# Patient Record
Sex: Female | Born: 1965 | Race: White | Hispanic: No | Marital: Single | Smoking: Never smoker
Health system: Southern US, Community
[De-identification: ages and names within clinical notes are randomized; demographics above are authoritative.]

## PROBLEM LIST (undated history)

## (undated) DIAGNOSIS — Z923 Personal history of irradiation: Secondary | ICD-10-CM

## (undated) DIAGNOSIS — M810 Age-related osteoporosis without current pathological fracture: Secondary | ICD-10-CM

## (undated) DIAGNOSIS — M069 Rheumatoid arthritis, unspecified: Secondary | ICD-10-CM

## (undated) DIAGNOSIS — Z9221 Personal history of antineoplastic chemotherapy: Secondary | ICD-10-CM

## (undated) DIAGNOSIS — E039 Hypothyroidism, unspecified: Secondary | ICD-10-CM

## (undated) DIAGNOSIS — F418 Other specified anxiety disorders: Secondary | ICD-10-CM

## (undated) DIAGNOSIS — C50919 Malignant neoplasm of unspecified site of unspecified female breast: Secondary | ICD-10-CM

## (undated) HISTORY — DX: Rheumatoid arthritis, unspecified: M06.9

## (undated) HISTORY — PX: BREAST BIOPSY: SHX20

## (undated) HISTORY — DX: Personal history of irradiation: Z92.3

## (undated) HISTORY — DX: Hypothyroidism, unspecified: E03.9

## (undated) HISTORY — PX: THYROID SURGERY: SHX805

## (undated) HISTORY — PX: HAND SURGERY: SHX662

## (undated) HISTORY — DX: Malignant neoplasm of unspecified site of unspecified female breast: C50.919

## (undated) HISTORY — DX: Other specified anxiety disorders: F41.8

---
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
## 1992-12-24 HISTORY — PX: LIVER BIOPSY: SHX301

## 1994-12-24 HISTORY — PX: CHOLECYSTECTOMY: SHX55

## 1998-09-13 ENCOUNTER — Other Ambulatory Visit: Admission: RE | Admit: 1998-09-13 | Discharge: 1998-09-13 | Payer: Self-pay | Admitting: Obstetrics and Gynecology

## 1999-08-26 ENCOUNTER — Emergency Department (HOSPITAL_COMMUNITY): Admission: EM | Admit: 1999-08-26 | Discharge: 1999-08-26 | Payer: Self-pay | Admitting: Emergency Medicine

## 1999-08-27 ENCOUNTER — Encounter: Payer: Self-pay | Admitting: Emergency Medicine

## 1999-09-15 ENCOUNTER — Other Ambulatory Visit: Admission: RE | Admit: 1999-09-15 | Discharge: 1999-09-15 | Payer: Self-pay | Admitting: Obstetrics and Gynecology

## 2000-07-23 ENCOUNTER — Emergency Department (HOSPITAL_COMMUNITY): Admission: EM | Admit: 2000-07-23 | Discharge: 2000-07-23 | Payer: Self-pay | Admitting: Emergency Medicine

## 2000-08-29 ENCOUNTER — Other Ambulatory Visit: Admission: RE | Admit: 2000-08-29 | Discharge: 2000-08-29 | Payer: Self-pay | Admitting: Obstetrics and Gynecology

## 2000-12-09 ENCOUNTER — Encounter: Payer: Self-pay | Admitting: Internal Medicine

## 2000-12-09 ENCOUNTER — Encounter: Admission: RE | Admit: 2000-12-09 | Discharge: 2000-12-09 | Payer: Self-pay | Admitting: Internal Medicine

## 2001-01-07 ENCOUNTER — Ambulatory Visit (HOSPITAL_COMMUNITY): Admission: RE | Admit: 2001-01-07 | Discharge: 2001-01-08 | Payer: Self-pay | Admitting: *Deleted

## 2001-01-07 ENCOUNTER — Encounter (INDEPENDENT_AMBULATORY_CARE_PROVIDER_SITE_OTHER): Payer: Self-pay | Admitting: Specialist

## 2001-07-12 ENCOUNTER — Emergency Department (HOSPITAL_COMMUNITY): Admission: EM | Admit: 2001-07-12 | Discharge: 2001-07-12 | Payer: Self-pay | Admitting: Emergency Medicine

## 2001-09-11 ENCOUNTER — Other Ambulatory Visit: Admission: RE | Admit: 2001-09-11 | Discharge: 2001-09-11 | Payer: Self-pay | Admitting: Obstetrics and Gynecology

## 2001-12-24 HISTORY — PX: BREAST LUMPECTOMY: SHX2

## 2002-09-17 ENCOUNTER — Other Ambulatory Visit: Admission: RE | Admit: 2002-09-17 | Discharge: 2002-09-17 | Payer: Self-pay | Admitting: Obstetrics and Gynecology

## 2002-11-13 ENCOUNTER — Encounter: Payer: Self-pay | Admitting: Obstetrics and Gynecology

## 2002-11-13 ENCOUNTER — Encounter (INDEPENDENT_AMBULATORY_CARE_PROVIDER_SITE_OTHER): Payer: Self-pay | Admitting: *Deleted

## 2002-11-13 ENCOUNTER — Encounter: Admission: RE | Admit: 2002-11-13 | Discharge: 2002-11-13 | Payer: Self-pay | Admitting: Obstetrics and Gynecology

## 2002-12-02 ENCOUNTER — Encounter (INDEPENDENT_AMBULATORY_CARE_PROVIDER_SITE_OTHER): Payer: Self-pay | Admitting: *Deleted

## 2002-12-02 ENCOUNTER — Ambulatory Visit (HOSPITAL_BASED_OUTPATIENT_CLINIC_OR_DEPARTMENT_OTHER): Admission: RE | Admit: 2002-12-02 | Discharge: 2002-12-02 | Payer: Self-pay | Admitting: *Deleted

## 2002-12-22 ENCOUNTER — Ambulatory Visit (HOSPITAL_BASED_OUTPATIENT_CLINIC_OR_DEPARTMENT_OTHER): Admission: RE | Admit: 2002-12-22 | Discharge: 2002-12-22 | Payer: Self-pay | Admitting: *Deleted

## 2002-12-22 ENCOUNTER — Encounter (INDEPENDENT_AMBULATORY_CARE_PROVIDER_SITE_OTHER): Payer: Self-pay | Admitting: Specialist

## 2002-12-22 ENCOUNTER — Encounter: Payer: Self-pay | Admitting: *Deleted

## 2003-01-08 ENCOUNTER — Ambulatory Visit (HOSPITAL_COMMUNITY): Admission: RE | Admit: 2003-01-08 | Discharge: 2003-01-08 | Payer: Self-pay | Admitting: Oncology

## 2003-01-11 ENCOUNTER — Encounter: Payer: Self-pay | Admitting: Oncology

## 2003-01-11 ENCOUNTER — Ambulatory Visit (HOSPITAL_COMMUNITY): Admission: RE | Admit: 2003-01-11 | Discharge: 2003-01-11 | Payer: Self-pay | Admitting: Oncology

## 2003-01-14 ENCOUNTER — Ambulatory Visit (HOSPITAL_COMMUNITY): Admission: RE | Admit: 2003-01-14 | Discharge: 2003-01-14 | Payer: Self-pay | Admitting: Oncology

## 2003-01-14 ENCOUNTER — Encounter: Payer: Self-pay | Admitting: Oncology

## 2003-01-18 ENCOUNTER — Ambulatory Visit (HOSPITAL_BASED_OUTPATIENT_CLINIC_OR_DEPARTMENT_OTHER): Admission: RE | Admit: 2003-01-18 | Discharge: 2003-01-18 | Payer: Self-pay | Admitting: *Deleted

## 2003-01-18 ENCOUNTER — Encounter: Payer: Self-pay | Admitting: *Deleted

## 2003-01-27 ENCOUNTER — Encounter: Payer: Self-pay | Admitting: Emergency Medicine

## 2003-01-28 ENCOUNTER — Inpatient Hospital Stay (HOSPITAL_COMMUNITY): Admission: EM | Admit: 2003-01-28 | Discharge: 2003-01-31 | Payer: Self-pay | Admitting: Emergency Medicine

## 2003-01-29 ENCOUNTER — Encounter: Payer: Self-pay | Admitting: Oncology

## 2003-01-30 ENCOUNTER — Encounter: Payer: Self-pay | Admitting: Oncology

## 2003-05-11 ENCOUNTER — Inpatient Hospital Stay (HOSPITAL_COMMUNITY): Admission: EM | Admit: 2003-05-11 | Discharge: 2003-05-13 | Payer: Self-pay | Admitting: Oncology

## 2003-05-11 ENCOUNTER — Encounter: Payer: Self-pay | Admitting: Oncology

## 2003-05-28 ENCOUNTER — Ambulatory Visit (HOSPITAL_BASED_OUTPATIENT_CLINIC_OR_DEPARTMENT_OTHER): Admission: RE | Admit: 2003-05-28 | Discharge: 2003-05-28 | Payer: Self-pay | Admitting: *Deleted

## 2003-06-11 ENCOUNTER — Ambulatory Visit: Admission: RE | Admit: 2003-06-11 | Discharge: 2003-09-01 | Payer: Self-pay | Admitting: Radiation Oncology

## 2003-06-16 ENCOUNTER — Encounter: Admission: RE | Admit: 2003-06-16 | Discharge: 2003-06-16 | Payer: Self-pay | Admitting: Radiation Oncology

## 2003-06-23 ENCOUNTER — Encounter (HOSPITAL_COMMUNITY): Admission: RE | Admit: 2003-06-23 | Discharge: 2003-09-21 | Payer: Self-pay | Admitting: Radiation Oncology

## 2003-09-06 ENCOUNTER — Other Ambulatory Visit: Admission: RE | Admit: 2003-09-06 | Discharge: 2003-09-06 | Payer: Self-pay | Admitting: Obstetrics and Gynecology

## 2003-11-09 ENCOUNTER — Ambulatory Visit (HOSPITAL_COMMUNITY): Admission: RE | Admit: 2003-11-09 | Discharge: 2003-11-09 | Payer: Self-pay | Admitting: Radiation Oncology

## 2003-11-23 ENCOUNTER — Ambulatory Visit: Admission: RE | Admit: 2003-11-23 | Discharge: 2003-11-23 | Payer: Self-pay | Admitting: Radiation Oncology

## 2003-11-25 ENCOUNTER — Ambulatory Visit (HOSPITAL_COMMUNITY): Admission: RE | Admit: 2003-11-25 | Discharge: 2003-11-25 | Payer: Self-pay | Admitting: Oncology

## 2003-12-13 ENCOUNTER — Encounter: Admission: RE | Admit: 2003-12-13 | Discharge: 2003-12-13 | Payer: Self-pay | Admitting: Radiation Oncology

## 2004-02-01 ENCOUNTER — Ambulatory Visit: Admission: RE | Admit: 2004-02-01 | Discharge: 2004-02-01 | Payer: Self-pay | Admitting: Radiation Oncology

## 2004-04-12 ENCOUNTER — Ambulatory Visit: Admission: RE | Admit: 2004-04-12 | Discharge: 2004-04-12 | Payer: Self-pay | Admitting: Radiation Oncology

## 2004-07-25 ENCOUNTER — Ambulatory Visit: Admission: RE | Admit: 2004-07-25 | Discharge: 2004-07-25 | Payer: Self-pay | Admitting: Radiation Oncology

## 2004-09-13 ENCOUNTER — Other Ambulatory Visit: Admission: RE | Admit: 2004-09-13 | Discharge: 2004-09-13 | Payer: Self-pay | Admitting: Obstetrics and Gynecology

## 2004-09-26 ENCOUNTER — Ambulatory Visit: Admission: RE | Admit: 2004-09-26 | Discharge: 2004-09-26 | Payer: Self-pay | Admitting: Radiation Oncology

## 2004-11-28 ENCOUNTER — Ambulatory Visit: Admission: RE | Admit: 2004-11-28 | Discharge: 2004-11-28 | Payer: Self-pay | Admitting: Radiation Oncology

## 2004-12-13 ENCOUNTER — Encounter: Admission: RE | Admit: 2004-12-13 | Discharge: 2004-12-13 | Payer: Self-pay | Admitting: Oncology

## 2004-12-23 ENCOUNTER — Encounter: Admission: RE | Admit: 2004-12-23 | Discharge: 2004-12-23 | Payer: Self-pay | Admitting: Oncology

## 2005-01-02 ENCOUNTER — Ambulatory Visit: Payer: Self-pay | Admitting: Oncology

## 2005-01-31 ENCOUNTER — Ambulatory Visit: Admission: RE | Admit: 2005-01-31 | Discharge: 2005-01-31 | Payer: Self-pay | Admitting: Radiation Oncology

## 2005-02-05 ENCOUNTER — Ambulatory Visit (HOSPITAL_COMMUNITY): Admission: RE | Admit: 2005-02-05 | Discharge: 2005-02-05 | Payer: Self-pay | Admitting: Oncology

## 2005-05-04 ENCOUNTER — Ambulatory Visit: Payer: Self-pay | Admitting: Oncology

## 2005-08-21 ENCOUNTER — Other Ambulatory Visit: Admission: RE | Admit: 2005-08-21 | Discharge: 2005-08-21 | Payer: Self-pay | Admitting: Obstetrics and Gynecology

## 2005-09-18 ENCOUNTER — Ambulatory Visit: Payer: Self-pay | Admitting: Oncology

## 2005-10-03 ENCOUNTER — Ambulatory Visit: Admission: RE | Admit: 2005-10-03 | Discharge: 2005-10-05 | Payer: Self-pay | Admitting: Radiation Oncology

## 2005-12-13 ENCOUNTER — Encounter: Admission: RE | Admit: 2005-12-13 | Discharge: 2005-12-13 | Payer: Self-pay | Admitting: Oncology

## 2006-03-20 ENCOUNTER — Ambulatory Visit: Payer: Self-pay | Admitting: Oncology

## 2006-04-03 ENCOUNTER — Encounter: Payer: Self-pay | Admitting: Cardiology

## 2006-04-03 ENCOUNTER — Ambulatory Visit: Admission: RE | Admit: 2006-04-03 | Discharge: 2006-04-03 | Payer: Self-pay | Admitting: Oncology

## 2006-08-03 ENCOUNTER — Emergency Department (HOSPITAL_COMMUNITY): Admission: EM | Admit: 2006-08-03 | Discharge: 2006-08-04 | Payer: Self-pay | Admitting: Emergency Medicine

## 2006-09-16 ENCOUNTER — Ambulatory Visit: Payer: Self-pay | Admitting: Oncology

## 2006-09-18 LAB — COMPREHENSIVE METABOLIC PANEL
ALT: 10 U/L (ref 0–40)
AST: 14 U/L (ref 0–37)
CO2: 29 mEq/L (ref 19–32)
Chloride: 103 mEq/L (ref 96–112)
Creatinine, Ser: 0.82 mg/dL (ref 0.40–1.20)
Sodium: 139 mEq/L (ref 135–145)
Total Bilirubin: 0.4 mg/dL (ref 0.3–1.2)
Total Protein: 7.2 g/dL (ref 6.0–8.3)

## 2006-09-18 LAB — CBC WITH DIFFERENTIAL/PLATELET
BASO%: 0.5 % (ref 0.0–2.0)
EOS%: 0.8 % (ref 0.0–7.0)
LYMPH%: 35 % (ref 14.0–48.0)
MCH: 31.3 pg (ref 26.0–34.0)
MCHC: 34.1 g/dL (ref 32.0–36.0)
MONO#: 0.3 10*3/uL (ref 0.1–0.9)
RBC: 4.39 10*6/uL (ref 3.70–5.32)
WBC: 4.1 10*3/uL (ref 3.9–10.0)
lymph#: 1.4 10*3/uL (ref 0.9–3.3)

## 2006-09-18 LAB — LACTATE DEHYDROGENASE: LDH: 134 U/L (ref 94–250)

## 2006-12-19 ENCOUNTER — Encounter: Admission: RE | Admit: 2006-12-19 | Discharge: 2006-12-19 | Payer: Self-pay | Admitting: Oncology

## 2007-03-20 ENCOUNTER — Ambulatory Visit: Payer: Self-pay | Admitting: Oncology

## 2007-03-25 LAB — CBC WITH DIFFERENTIAL/PLATELET
BASO%: 0.4 % (ref 0.0–2.0)
Basophils Absolute: 0 10*3/uL (ref 0.0–0.1)
EOS%: 0.9 % (ref 0.0–7.0)
HCT: 37.5 % (ref 34.8–46.6)
HGB: 13.2 g/dL (ref 11.6–15.9)
LYMPH%: 37.7 % (ref 14.0–48.0)
MCH: 32.4 pg (ref 26.0–34.0)
MCHC: 35.2 g/dL (ref 32.0–36.0)
MCV: 92.2 fL (ref 81.0–101.0)
MONO%: 7.7 % (ref 0.0–13.0)
NEUT%: 53.3 % (ref 39.6–76.8)
lymph#: 1.9 10*3/uL (ref 0.9–3.3)

## 2007-03-25 LAB — COMPREHENSIVE METABOLIC PANEL
ALT: 10 U/L (ref 0–35)
AST: 17 U/L (ref 0–37)
Alkaline Phosphatase: 111 U/L (ref 39–117)
BUN: 7 mg/dL (ref 6–23)
Calcium: 9.2 mg/dL (ref 8.4–10.5)
Creatinine, Ser: 0.77 mg/dL (ref 0.40–1.20)
Total Bilirubin: 0.5 mg/dL (ref 0.3–1.2)

## 2007-10-03 ENCOUNTER — Ambulatory Visit: Payer: Self-pay | Admitting: Oncology

## 2007-10-08 LAB — CBC WITH DIFFERENTIAL/PLATELET
BASO%: 0.4 % (ref 0.0–2.0)
EOS%: 0.8 % (ref 0.0–7.0)
MCH: 32.7 pg (ref 26.0–34.0)
MCHC: 35.1 g/dL (ref 32.0–36.0)
MCV: 93 fL (ref 81.0–101.0)
MONO%: 4.7 % (ref 0.0–13.0)
RBC: 4.07 10*6/uL (ref 3.70–5.32)
RDW: 11.7 % (ref 11.3–14.5)
lymph#: 1.7 10*3/uL (ref 0.9–3.3)

## 2007-10-08 LAB — COMPREHENSIVE METABOLIC PANEL
ALT: 13 U/L (ref 0–35)
AST: 15 U/L (ref 0–37)
Albumin: 4.5 g/dL (ref 3.5–5.2)
Alkaline Phosphatase: 78 U/L (ref 39–117)
Calcium: 9.2 mg/dL (ref 8.4–10.5)
Chloride: 103 mEq/L (ref 96–112)
Potassium: 3.9 mEq/L (ref 3.5–5.3)

## 2007-11-18 ENCOUNTER — Ambulatory Visit: Payer: Self-pay | Admitting: Oncology

## 2007-12-22 ENCOUNTER — Encounter: Admission: RE | Admit: 2007-12-22 | Discharge: 2007-12-22 | Payer: Self-pay | Admitting: Oncology

## 2008-02-10 ENCOUNTER — Ambulatory Visit (HOSPITAL_BASED_OUTPATIENT_CLINIC_OR_DEPARTMENT_OTHER): Admission: RE | Admit: 2008-02-10 | Discharge: 2008-02-10 | Payer: Self-pay | Admitting: Orthopedic Surgery

## 2008-02-18 ENCOUNTER — Encounter: Admission: RE | Admit: 2008-02-18 | Discharge: 2008-02-18 | Payer: Self-pay | Admitting: Internal Medicine

## 2008-04-15 ENCOUNTER — Emergency Department (HOSPITAL_COMMUNITY): Admission: EM | Admit: 2008-04-15 | Discharge: 2008-04-15 | Payer: Self-pay | Admitting: Emergency Medicine

## 2008-05-07 ENCOUNTER — Ambulatory Visit: Payer: Self-pay | Admitting: Oncology

## 2008-08-05 ENCOUNTER — Ambulatory Visit: Payer: Self-pay | Admitting: Oncology

## 2008-08-05 ENCOUNTER — Ambulatory Visit: Admission: RE | Admit: 2008-08-05 | Discharge: 2008-08-05 | Payer: Self-pay | Admitting: Radiation Oncology

## 2008-08-05 LAB — COMPREHENSIVE METABOLIC PANEL
ALT: 14 U/L (ref 0–35)
AST: 19 U/L (ref 0–37)
Alkaline Phosphatase: 93 U/L (ref 39–117)
Creatinine, Ser: 0.74 mg/dL (ref 0.40–1.20)
Sodium: 138 mEq/L (ref 135–145)
Total Bilirubin: 0.5 mg/dL (ref 0.3–1.2)
Total Protein: 7.3 g/dL (ref 6.0–8.3)

## 2008-08-05 LAB — CBC WITH DIFFERENTIAL/PLATELET
BASO%: 0.6 % (ref 0.0–2.0)
EOS%: 1.3 % (ref 0.0–7.0)
LYMPH%: 36.6 % (ref 14.0–48.0)
MCH: 32.6 pg (ref 26.0–34.0)
MCHC: 34.6 g/dL (ref 32.0–36.0)
MONO#: 0.4 10*3/uL (ref 0.1–0.9)
MONO%: 9 % (ref 0.0–13.0)
Platelets: 189 10*3/uL (ref 145–400)
RBC: 4.12 10*6/uL (ref 3.70–5.32)
WBC: 4.4 10*3/uL (ref 3.9–10.0)

## 2008-08-09 ENCOUNTER — Encounter: Admission: RE | Admit: 2008-08-09 | Discharge: 2008-08-09 | Payer: Self-pay | Admitting: Radiation Oncology

## 2008-09-13 ENCOUNTER — Ambulatory Visit: Payer: Self-pay | Admitting: Pulmonary Disease

## 2008-09-13 DIAGNOSIS — R05 Cough: Secondary | ICD-10-CM

## 2008-09-13 DIAGNOSIS — J309 Allergic rhinitis, unspecified: Secondary | ICD-10-CM | POA: Insufficient documentation

## 2008-09-13 DIAGNOSIS — J479 Bronchiectasis, uncomplicated: Secondary | ICD-10-CM | POA: Insufficient documentation

## 2008-09-13 DIAGNOSIS — R51 Headache: Secondary | ICD-10-CM | POA: Insufficient documentation

## 2008-09-13 DIAGNOSIS — Z853 Personal history of malignant neoplasm of breast: Secondary | ICD-10-CM | POA: Insufficient documentation

## 2008-09-13 DIAGNOSIS — R059 Cough, unspecified: Secondary | ICD-10-CM | POA: Insufficient documentation

## 2008-09-13 DIAGNOSIS — R0602 Shortness of breath: Secondary | ICD-10-CM | POA: Insufficient documentation

## 2008-09-13 DIAGNOSIS — R519 Headache, unspecified: Secondary | ICD-10-CM | POA: Insufficient documentation

## 2008-09-28 ENCOUNTER — Ambulatory Visit: Payer: Self-pay | Admitting: Pulmonary Disease

## 2008-10-07 ENCOUNTER — Ambulatory Visit: Payer: Self-pay | Admitting: Pulmonary Disease

## 2008-10-11 ENCOUNTER — Telehealth (INDEPENDENT_AMBULATORY_CARE_PROVIDER_SITE_OTHER): Payer: Self-pay | Admitting: *Deleted

## 2008-12-01 ENCOUNTER — Encounter: Admission: RE | Admit: 2008-12-01 | Discharge: 2008-12-01 | Payer: Self-pay | Admitting: Endocrinology

## 2008-12-06 ENCOUNTER — Encounter: Payer: Self-pay | Admitting: Pulmonary Disease

## 2008-12-15 ENCOUNTER — Ambulatory Visit: Payer: Self-pay | Admitting: Oncology

## 2008-12-21 LAB — CBC WITH DIFFERENTIAL/PLATELET
EOS%: 0.8 % (ref 0.0–7.0)
LYMPH%: 38.1 % (ref 14.0–48.0)
MCH: 32.8 pg (ref 26.0–34.0)
MCHC: 34.2 g/dL (ref 32.0–36.0)
MCV: 95.8 fL (ref 81.0–101.0)
MONO%: 6.3 % (ref 0.0–13.0)
Platelets: 251 10*3/uL (ref 145–400)
RBC: 4.16 10*6/uL (ref 3.70–5.32)
RDW: 13.5 % (ref 11.3–14.5)

## 2008-12-22 ENCOUNTER — Encounter: Admission: RE | Admit: 2008-12-22 | Discharge: 2008-12-22 | Payer: Self-pay | Admitting: Oncology

## 2008-12-22 LAB — COMPREHENSIVE METABOLIC PANEL
AST: 19 U/L (ref 0–37)
Albumin: 4.7 g/dL (ref 3.5–5.2)
BUN: 7 mg/dL (ref 6–23)
Calcium: 9.3 mg/dL (ref 8.4–10.5)
Chloride: 103 mEq/L (ref 96–112)
Glucose, Bld: 73 mg/dL (ref 70–99)
Potassium: 3.6 mEq/L (ref 3.5–5.3)
Total Protein: 7.5 g/dL (ref 6.0–8.3)

## 2008-12-30 ENCOUNTER — Emergency Department (HOSPITAL_COMMUNITY): Admission: EM | Admit: 2008-12-30 | Discharge: 2008-12-31 | Payer: Self-pay | Admitting: Emergency Medicine

## 2009-01-06 ENCOUNTER — Ambulatory Visit (HOSPITAL_COMMUNITY): Admission: RE | Admit: 2009-01-06 | Discharge: 2009-01-06 | Payer: Self-pay | Admitting: Orthopedic Surgery

## 2009-01-19 ENCOUNTER — Ambulatory Visit (HOSPITAL_COMMUNITY): Admission: RE | Admit: 2009-01-19 | Discharge: 2009-01-19 | Payer: Self-pay | Admitting: Oncology

## 2009-02-18 ENCOUNTER — Emergency Department (HOSPITAL_COMMUNITY): Admission: EM | Admit: 2009-02-18 | Discharge: 2009-02-18 | Payer: Self-pay | Admitting: Emergency Medicine

## 2009-02-25 ENCOUNTER — Encounter: Admission: RE | Admit: 2009-02-25 | Discharge: 2009-02-25 | Payer: Self-pay | Admitting: Internal Medicine

## 2009-04-27 ENCOUNTER — Ambulatory Visit: Admission: RE | Admit: 2009-04-27 | Discharge: 2009-04-28 | Payer: Self-pay | Admitting: Radiation Oncology

## 2009-04-28 LAB — CBC WITH DIFFERENTIAL/PLATELET
BASO%: 0.4 % (ref 0.0–2.0)
EOS%: 0.9 % (ref 0.0–7.0)
Eosinophils Absolute: 0 10*3/uL (ref 0.0–0.5)
LYMPH%: 39.5 % (ref 14.0–49.7)
MCHC: 34 g/dL (ref 31.5–36.0)
MCV: 97.4 fL (ref 79.5–101.0)
MONO%: 8.1 % (ref 0.0–14.0)
NEUT#: 2 10*3/uL (ref 1.5–6.5)
RBC: 4.07 10*6/uL (ref 3.70–5.45)
RDW: 13.3 % (ref 11.2–14.5)

## 2009-04-28 LAB — COMPREHENSIVE METABOLIC PANEL
ALT: 15 U/L (ref 0–35)
AST: 19 U/L (ref 0–37)
Albumin: 4.6 g/dL (ref 3.5–5.2)
Alkaline Phosphatase: 88 U/L (ref 39–117)
Glucose, Bld: 63 mg/dL — ABNORMAL LOW (ref 70–99)
Potassium: 3.5 mEq/L (ref 3.5–5.3)
Sodium: 142 mEq/L (ref 135–145)
Total Bilirubin: 0.4 mg/dL (ref 0.3–1.2)
Total Protein: 7.3 g/dL (ref 6.0–8.3)

## 2009-05-03 ENCOUNTER — Ambulatory Visit (HOSPITAL_COMMUNITY): Admission: RE | Admit: 2009-05-03 | Discharge: 2009-05-03 | Payer: Self-pay | Admitting: Radiation Oncology

## 2009-05-06 ENCOUNTER — Emergency Department (HOSPITAL_COMMUNITY): Admission: EM | Admit: 2009-05-06 | Discharge: 2009-05-07 | Payer: Self-pay | Admitting: Emergency Medicine

## 2009-05-10 ENCOUNTER — Ambulatory Visit (HOSPITAL_BASED_OUTPATIENT_CLINIC_OR_DEPARTMENT_OTHER): Admission: RE | Admit: 2009-05-10 | Discharge: 2009-05-10 | Payer: Self-pay | Admitting: Orthopedic Surgery

## 2009-06-17 ENCOUNTER — Ambulatory Visit: Payer: Self-pay | Admitting: Oncology

## 2009-06-21 LAB — CBC WITH DIFFERENTIAL/PLATELET
Basophils Absolute: 0 10*3/uL (ref 0.0–0.1)
EOS%: 0.8 % (ref 0.0–7.0)
Eosinophils Absolute: 0 10*3/uL (ref 0.0–0.5)
HGB: 13.8 g/dL (ref 11.6–15.9)
NEUT#: 2.8 10*3/uL (ref 1.5–6.5)
RDW: 12.5 % (ref 11.2–14.5)
lymph#: 1.5 10*3/uL (ref 0.9–3.3)

## 2009-06-21 LAB — COMPREHENSIVE METABOLIC PANEL
AST: 21 U/L (ref 0–37)
Albumin: 4.3 g/dL (ref 3.5–5.2)
BUN: 7 mg/dL (ref 6–23)
Calcium: 9.6 mg/dL (ref 8.4–10.5)
Chloride: 105 mEq/L (ref 96–112)
Glucose, Bld: 67 mg/dL — ABNORMAL LOW (ref 70–99)
Potassium: 3.5 mEq/L (ref 3.5–5.3)
Sodium: 140 mEq/L (ref 135–145)
Total Protein: 7.3 g/dL (ref 6.0–8.3)

## 2009-07-29 ENCOUNTER — Encounter: Admission: RE | Admit: 2009-07-29 | Discharge: 2009-07-29 | Payer: Self-pay | Admitting: Neurological Surgery

## 2009-12-22 ENCOUNTER — Ambulatory Visit: Payer: Self-pay | Admitting: Oncology

## 2009-12-27 LAB — CBC WITH DIFFERENTIAL/PLATELET
BASO%: 0.5 % (ref 0.0–2.0)
Eosinophils Absolute: 0 10*3/uL (ref 0.0–0.5)
HCT: 40.6 % (ref 34.8–46.6)
HGB: 13.7 g/dL (ref 11.6–15.9)
MCHC: 33.7 g/dL (ref 31.5–36.0)
MONO#: 0.3 10*3/uL (ref 0.1–0.9)
NEUT#: 2.5 10*3/uL (ref 1.5–6.5)
NEUT%: 56.8 % (ref 38.4–76.8)
Platelets: 214 10*3/uL (ref 145–400)
WBC: 4.5 10*3/uL (ref 3.9–10.3)
lymph#: 1.5 10*3/uL (ref 0.9–3.3)

## 2009-12-27 LAB — COMPREHENSIVE METABOLIC PANEL
ALT: 16 U/L (ref 0–35)
CO2: 31 mEq/L (ref 19–32)
Calcium: 9.2 mg/dL (ref 8.4–10.5)
Chloride: 101 mEq/L (ref 96–112)
Creatinine, Ser: 0.72 mg/dL (ref 0.40–1.20)
Glucose, Bld: 67 mg/dL — ABNORMAL LOW (ref 70–99)
Total Protein: 7.5 g/dL (ref 6.0–8.3)

## 2009-12-27 LAB — LACTATE DEHYDROGENASE: LDH: 142 U/L (ref 94–250)

## 2009-12-29 ENCOUNTER — Encounter: Admission: RE | Admit: 2009-12-29 | Discharge: 2009-12-29 | Payer: Self-pay | Admitting: Oncology

## 2010-01-27 ENCOUNTER — Encounter: Admission: RE | Admit: 2010-01-27 | Discharge: 2010-01-27 | Payer: Self-pay | Admitting: Surgery

## 2010-07-12 ENCOUNTER — Ambulatory Visit: Payer: Self-pay | Admitting: Oncology

## 2010-07-14 LAB — CBC WITH DIFFERENTIAL/PLATELET
BASO%: 0.5 % (ref 0.0–2.0)
EOS%: 0.9 % (ref 0.0–7.0)
HGB: 13.2 g/dL (ref 11.6–15.9)
MCH: 32 pg (ref 25.1–34.0)
MCHC: 33.8 g/dL (ref 31.5–36.0)
RDW: 13.1 % (ref 11.2–14.5)
WBC: 3.6 10*3/uL — ABNORMAL LOW (ref 3.9–10.3)
lymph#: 1.5 10*3/uL (ref 0.9–3.3)

## 2010-07-14 LAB — COMPREHENSIVE METABOLIC PANEL
ALT: 21 U/L (ref 0–35)
AST: 21 U/L (ref 0–37)
Albumin: 4.4 g/dL (ref 3.5–5.2)
Calcium: 9.3 mg/dL (ref 8.4–10.5)
Chloride: 105 mEq/L (ref 96–112)
Potassium: 3.7 mEq/L (ref 3.5–5.3)
Total Protein: 7.3 g/dL (ref 6.0–8.3)

## 2010-07-29 ENCOUNTER — Emergency Department (HOSPITAL_COMMUNITY): Admission: EM | Admit: 2010-07-29 | Discharge: 2010-07-29 | Payer: Self-pay | Admitting: Emergency Medicine

## 2011-01-02 ENCOUNTER — Encounter
Admission: RE | Admit: 2011-01-02 | Discharge: 2011-01-02 | Payer: Self-pay | Source: Home / Self Care | Attending: Oncology | Admitting: Oncology

## 2011-01-09 ENCOUNTER — Ambulatory Visit: Payer: Self-pay | Admitting: Oncology

## 2011-01-11 LAB — COMPREHENSIVE METABOLIC PANEL
ALT: 16 U/L (ref 0–35)
AST: 22 U/L (ref 0–37)
Albumin: 4.2 g/dL (ref 3.5–5.2)
Alkaline Phosphatase: 67 U/L (ref 39–117)
BUN: 4 mg/dL — ABNORMAL LOW (ref 6–23)
CO2: 29 mEq/L (ref 19–32)
Calcium: 9.6 mg/dL (ref 8.4–10.5)
Chloride: 106 mEq/L (ref 96–112)
Creatinine, Ser: 0.9 mg/dL (ref 0.40–1.20)
Glucose, Bld: 68 mg/dL — ABNORMAL LOW (ref 70–99)
Potassium: 3.4 mEq/L — ABNORMAL LOW (ref 3.5–5.3)
Sodium: 143 mEq/L (ref 135–145)
Total Bilirubin: 0.7 mg/dL (ref 0.3–1.2)
Total Protein: 7.5 g/dL (ref 6.0–8.3)

## 2011-01-11 LAB — CBC WITH DIFFERENTIAL/PLATELET
BASO%: 0.4 % (ref 0.0–2.0)
Basophils Absolute: 0 10*3/uL (ref 0.0–0.1)
EOS%: 1 % (ref 0.0–7.0)
Eosinophils Absolute: 0 10*3/uL (ref 0.0–0.5)
HCT: 39.7 % (ref 34.8–46.6)
HGB: 13.6 g/dL (ref 11.6–15.9)
LYMPH%: 43.7 % (ref 14.0–49.7)
MCH: 32.3 pg (ref 25.1–34.0)
MCHC: 34.2 g/dL (ref 31.5–36.0)
MCV: 94.7 fL (ref 79.5–101.0)
MONO#: 0.3 10*3/uL (ref 0.1–0.9)
MONO%: 8 % (ref 0.0–14.0)
NEUT#: 1.8 10*3/uL (ref 1.5–6.5)
NEUT%: 46.9 % (ref 38.4–76.8)
Platelets: 191 10*3/uL (ref 145–400)
RBC: 4.19 10*6/uL (ref 3.70–5.45)
RDW: 12.5 % (ref 11.2–14.5)
WBC: 3.8 10*3/uL — ABNORMAL LOW (ref 3.9–10.3)
lymph#: 1.7 10*3/uL (ref 0.9–3.3)

## 2011-01-11 LAB — LACTATE DEHYDROGENASE: LDH: 143 U/L (ref 94–250)

## 2011-01-13 ENCOUNTER — Encounter: Payer: Self-pay | Admitting: Oncology

## 2011-01-14 ENCOUNTER — Encounter: Payer: Self-pay | Admitting: Orthopedic Surgery

## 2011-01-14 ENCOUNTER — Encounter: Payer: Self-pay | Admitting: Endocrinology

## 2011-01-14 ENCOUNTER — Encounter: Payer: Self-pay | Admitting: Oncology

## 2011-01-15 ENCOUNTER — Encounter: Payer: Self-pay | Admitting: Radiation Oncology

## 2011-01-16 ENCOUNTER — Encounter
Admission: RE | Admit: 2011-01-16 | Discharge: 2011-01-16 | Payer: Self-pay | Source: Home / Self Care | Attending: Oncology | Admitting: Oncology

## 2011-02-12 ENCOUNTER — Ambulatory Visit (INDEPENDENT_AMBULATORY_CARE_PROVIDER_SITE_OTHER): Payer: Medicare Other | Admitting: Psychiatry

## 2011-02-12 DIAGNOSIS — F3289 Other specified depressive episodes: Secondary | ICD-10-CM

## 2011-02-12 DIAGNOSIS — F329 Major depressive disorder, single episode, unspecified: Secondary | ICD-10-CM

## 2011-02-28 ENCOUNTER — Ambulatory Visit (INDEPENDENT_AMBULATORY_CARE_PROVIDER_SITE_OTHER): Payer: Medicare Other | Admitting: Psychiatry

## 2011-02-28 DIAGNOSIS — F329 Major depressive disorder, single episode, unspecified: Secondary | ICD-10-CM

## 2011-02-28 DIAGNOSIS — F3289 Other specified depressive episodes: Secondary | ICD-10-CM

## 2011-03-14 ENCOUNTER — Ambulatory Visit: Payer: Self-pay | Admitting: Physical Medicine and Rehabilitation

## 2011-03-15 ENCOUNTER — Ambulatory Visit (INDEPENDENT_AMBULATORY_CARE_PROVIDER_SITE_OTHER): Payer: Medicare Other | Admitting: Psychiatry

## 2011-03-15 DIAGNOSIS — F329 Major depressive disorder, single episode, unspecified: Secondary | ICD-10-CM

## 2011-03-15 DIAGNOSIS — F3289 Other specified depressive episodes: Secondary | ICD-10-CM

## 2011-03-19 ENCOUNTER — Ambulatory Visit (INDEPENDENT_AMBULATORY_CARE_PROVIDER_SITE_OTHER): Payer: Medicare Other | Admitting: Psychiatry

## 2011-03-19 DIAGNOSIS — F0630 Mood disorder due to known physiological condition, unspecified: Secondary | ICD-10-CM

## 2011-03-29 ENCOUNTER — Ambulatory Visit: Payer: Medicare Other | Admitting: Psychiatry

## 2011-04-12 ENCOUNTER — Ambulatory Visit (INDEPENDENT_AMBULATORY_CARE_PROVIDER_SITE_OTHER): Payer: Medicare Other | Admitting: Psychiatry

## 2011-04-12 DIAGNOSIS — F329 Major depressive disorder, single episode, unspecified: Secondary | ICD-10-CM

## 2011-04-12 DIAGNOSIS — F3289 Other specified depressive episodes: Secondary | ICD-10-CM

## 2011-04-24 ENCOUNTER — Ambulatory Visit (INDEPENDENT_AMBULATORY_CARE_PROVIDER_SITE_OTHER): Payer: Medicare Other | Admitting: Psychiatry

## 2011-04-24 DIAGNOSIS — F329 Major depressive disorder, single episode, unspecified: Secondary | ICD-10-CM

## 2011-04-24 DIAGNOSIS — F3289 Other specified depressive episodes: Secondary | ICD-10-CM

## 2011-05-01 ENCOUNTER — Ambulatory Visit: Payer: Medicare Other | Attending: Radiation Oncology | Admitting: Radiation Oncology

## 2011-05-03 ENCOUNTER — Ambulatory Visit: Payer: Medicare Other | Admitting: Psychiatry

## 2011-05-08 NOTE — Op Note (Signed)
NAME:  Carolyn Garcia, Carolyn Garcia               ACCOUNT NO.:  [id number]   MEDICAL RECORD NO.:  [id number]          PATIENT TYPE:  AMB   LOCATION:  DSC                          FACILITY:  [facility]   PHYSICIAN:  Katy Fitch. Sypher, M.D. DATE OF BIRTH:  11/22/1966   DATE OF PROCEDURE:  02/10/2008  DATE OF DISCHARGE:                               OPERATIVE REPORT   PREOPERATIVE DIAGNOSIS:  1. Subretinacular intra-articular dorsal ganglion, right wrist.  2. 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   POSTOPERATIVE DIAGNOSIS:  1. Subretinacular intra-articular dorsal ganglion, right wrist.  2. 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   OPERATION:  1. Excision of myxoid cyst dorsal aspect of right wrist scapholunate      ligament including intra-articular debridement.  2. Injection of right index finger metacarpal phalangeal joint with      Depo-Medrol and lidocaine 1% without epinephrine.   SURGEON:  Katy Fitch. Sypher, M.D.   ASSISTANT:  Marveen Reeks. Dasnoit, P.A.-C.   ANESTHESIA:  General by LMA.   SUPERVISING ANESTHESIOLOGIST:  Zenon Mayo, M.D.   INDICATIONS:  Carolyn Garcia is a 45 year old woman referred through the  courtesy of Dr. Syliva Overman for evaluation and management of a  painful right wrist.  She had a history of juvenile rheumatoid arthritis  managed by Dr. Jimmy Footman.  She has been quiescent with respect to her  primary joint complaints.  She was noted to have a mass on the dorsal  aspect of her right wrist.  Clinical examination suggested a  subretinacular dorsal ganglion.  We recommended excision for resolution  of her pain predicament.  A second predicament she noted was increasing  pain and swelling of her right index finger MP joint.  She is developing  progressive ulnar drift due to loss of integrity of the radial  collateral  ligament and radial sagittal fibers.  We advised her to  consider steroid injection at the time of her anesthesia into the right  index finger metacarpal phalangeal joint.  This was accepted after  informed consent.   PROCEDURE:  Carolyn Garcia is brought to the operating room and placed in  a supine position on the operating table.  Dr. Sampson Goon had  interviewed her in the holding area and provided anesthesia informed  consent.  General anesthesia by LMA technique was selected.  She is  brought to room 6 and placed in a supine position on the operating table  and under Dr. Jarrett Ables direct supervision, general anesthesia by LMA  technique induced.  Her right arm was prepped with Betadine soap  solution and sterilely draped.  A pneumatic tourniquet was applied to  the proximal right brachium.  Following exsanguination of the right arm  with an Esmarch bandage, the arterial tourniquet was inflated to 220  mmHg.   The procedure commenced with a transverse incision directly over the  palpable mass.  The subcutaneous tissues were carefully divided taking  care to identify and gently protect  the extensor retinaculum.  The soft  tissue planes were sequentially dissected with scissors dissection down  to the capsule of the wrist joint.  A 1 cm diameter firm ganglion was  noted directly over the scapholunate ligament.  This was  circumferentially dissected and subsequently removed piecemeal with a  rongeur.  A complete synovectomy of the dorsal aspect of the  scapholunate ligament accomplished.  There was no other synovium that  would facilitate a synovial biopsy noted within the wrist joint.  After  curettage of the fibers of the scapholunate ligament, the bipolar  forceps were used to electrodesiccate the superficial fibers of the  ligament to try to prevent recurrence of the ganglion.  The wound was  then repaired with subdermal suture of 4-0 Vicryl and intradermal 3-0  Prolene with  Steri-Strips.   Attention was directed to the right index finger MP joint.  The MP joint  was placed under traction and a 27 gauge needle placed in the ulnar  aspect of the joint.  The joint was distended with approximately 1 mL of  a 50/50 mixture of Depo-Medrol and lidocaine, 40 mg per mL.  There no  apparent complications.  The wound was then dressed with a Band-Aid.   For aftercare, Carolyn Garcia is advised to elevate her hand.  She is to work  on her range of motion exercises.  We will see her back for follow up in  the office in one week for suture removal.      Katy Fitch. Sypher, M.D.  Electronically Signed     RVS/MEDQ  D:  02/10/2008  T:  02/10/2008  Job:  [id number]

## 2011-05-08 NOTE — Op Note (Signed)
NAME:  Carolyn, Garcia               ACCOUNT NO.:  [id number]   MEDICAL RECORD NO.:  [id number]          PATIENT TYPE:  AMB   LOCATION:  DSC                          FACILITY:  [facility]   PHYSICIAN:  Katy Fitch. Sypher, M.D. DATE OF BIRTH:  [birth year]   DATE OF PROCEDURE:  05/10/2009  DATE OF DISCHARGE:                               OPERATIVE REPORT   PREOPERATIVE DIAGNOSES:  Comminuted periarticular fracture, right small  finger metacarpal proximal metaphysis extending into the diaphysis and  also rheumatoid arthritis with painful right index finger metacarpal  phalangeal joint.   POSTOPERATIVE DIAGNOSES:  1. Painful right index finger metacarpal phalangeal joint due to      synovitis and arthritis.  2. Intra-articular extension of comminuted fracture, right small      finger metacarpal with approximately five-part fracture with 2      butterfly fragments at metacarpal base.   OPERATION:  1. Open reduction and internal fixation of right small finger      metacarpal applying a 1.5-mm ASIF modular plate system with 1.5-mm      screws.  2. Injection of right index finger metacarpal phalangeal joint with 20      mg of Depo-Medrol and 1 mL of 2% lidocaine.   INDICATIONS:  Carolyn Garcia is a well-known patient with a history of  rheumatoid arthritis and a history of breast cancer treated in 2004.  On  May 06, 2009, she accidentally hit her right hand against a firm surface  sustaining an acute injury to the base of the small finger metacarpal.  She was seen at the emergency room where x-rays were obtained  documenting a comminuted unstable fracture of the fifth metacarpal at  its proximal metaphysis with comminution in the proximal fragment that  may enter the carpometacarpal joint.  She was splinted and advised to  follow up with an orthopedic surgeon.   She has been active patient with our practice and contacted my office  late on the evening of May 06, 2009, due to her splint being  uncomfortable and tight.   My physician's assistant met her at our office and changed splint to a  more comfortable safe position splint.   She was seen for evaluation and management on May 09, 2009, and was  noted to have a probable extension of her fracture into the  carpometacarpal joint at the base of the small finger metacarpal.   We took multiple oblique films and noted that she had about 25+ degrees  of apex volar angulation and shortening.  This would cause a difficult  malunion with loss of length and possible extensor lag.  Therefore, we  recommended open reduction and internal fixation.   During our informed consent, she also requested that I inject her right  index metacarpal phalangeal joint due to background rheumatoid arthritis  and ongoing pain.  Preoperatively, we discussed the risks and benefits  of surgery in detail.  She was advised to proceed with plate fixation of  the fractured metacarpal and injection of the right index metacarpal  phalangeal joint.   PROCEDURE:  Carolyn Garcia is  brought to the operating room and placed in  supine position upon the operating table.   She had an informed consent with Dr. Jacklynn Bue and agreed to general  anesthesia by LMA technique.  She was brought to room 8 of the [facility], placed in supine position upon the operating table, and  under Dr. Marlane Mingle direct supervision general anesthesia by LMA  technique induced.  Ancef 1 g was administered as an IV prophylactic  antibiotic.  The right arm was prepped with Betadine soap and solution,  sterilely draped.  A pneumatic tourniquet was applied to the proximal  right brachium.  Following exsanguination of the right arm with Esmarch  bandage, the arterial tourniquet was inflated to 230 mmHg.  The  procedure commenced with a longitudinal incision directly over the  metacarpal from the proximal metaphysis to the distal metaphysis.  Subcutaneous tissues were carefully  divided taking care to gently  protect the dorsal ulnar sensory branches.  The extensor tendons were  retracted radially, the hypothenar muscles ulnarly.  The periosteum was  incised, and the comminuted fracture inspected.  There were 3 major  parts of the proximal fragment.  The fracture did extend into the  epiphysis and joint.  The fracture was overriding and angulated about 30  degrees apex volar.   With great care, a dental pick was used to tease apart the fracture  fragments that were impacted followed by anatomic reduction achieved  with traction and application of a metacarpal compression clamp.   A 9-hole ASIF plate was selected and modified by removing several  segments applied with the T-portion of the plate at the base and the  long axis of the plate over the metacarpal diaphysis.  Appropriate  length screws were placed by use of a measuring device and C-arm  fluoroscopy.  We were able to anatomically reconstruct the metacarpal,  reduce the various metaphyseal and epiphyseal proximal fragments, and  realign the metacarpal anatomically.   Thereafter, the periosteum was repaired with a running 4-0 Vicryl  augmented by figure-of-eight 4-0 Vicryl.  The skin was repaired with  subcutaneous suture of 4-0 Vicryl and intradermal 3-0 Prolene.  A  compressive dressing was applied followed by injection of the index  metacarpal with 20 mg Depo-Medrol and 1 mL of 2% lidocaine.  Good joint  distention was achieved.   Carolyn Garcia tolerated both procedures well.  She was placed in a safe  position splint.  We will see her back for followup in our office in  approximately 6 days.   She is provided prescription for Dilaudid 2 mg 1 or 2 tablets p.o. q.4-6  hours p.r.n. pain, 30 tablets without refill.  Also, she will use Keflex  500 mg 1 p.o. q.8 hours x4 days as a prophylactic antibiotic.      Katy Fitch Sypher, M.D.  Electronically Signed     RVS/MEDQ  D:  05/10/2009  T:   05/11/2009  Job:  [id number]

## 2011-05-10 ENCOUNTER — Ambulatory Visit (INDEPENDENT_AMBULATORY_CARE_PROVIDER_SITE_OTHER): Payer: Medicare Other | Admitting: Psychiatry

## 2011-05-10 DIAGNOSIS — F3289 Other specified depressive episodes: Secondary | ICD-10-CM

## 2011-05-10 DIAGNOSIS — F329 Major depressive disorder, single episode, unspecified: Secondary | ICD-10-CM

## 2011-05-11 NOTE — Discharge Summary (Signed)
NAME:  Carolyn Garcia, Carolyn Garcia                      ACCOUNT NO.:  [id number]   MEDICAL RECORD NO.:  [id number]                   PATIENT TYPE:  INP   LOCATION:  0260                                 FACILITY:  [facility]   PHYSICIAN:  Leighton Roach. Truett Perna, M.D.              DATE OF BIRTH:  1966/12/24   DATE OF ADMISSION:  05/11/2003  DATE OF DISCHARGE:  05/13/2003                                 DISCHARGE SUMMARY   DISCHARGE DIAGNOSES:  1. Neutropenia secondary to chemotherapy, resolving.  2. Anemia/thrombocytopenia secondary to chemotherapy.  3. Diarrhea, resolved.  4. Stage II breast cancer status post cycle number six     adriamycin/Taxotere/Cytoxan.  5. Rheumatoid arthritis on chronic steroids.  6. Hypothyroid on Synthroid.  7. Gastroesophageal reflux disease on Protonix.   CONSULTATIONS:  None.   PROCEDURE:  Intravenous antibiotics.   HISTORY OF PRESENT ILLNESS:  The patient is a 45 year old woman who was  diagnosed with stage II breast cancer in November 2003.  She underwent  lumpectomy with pathology showing a 2.5 cm invasive mammary ductal carcinoma  with lymphovascular invasion and negative margins.  This was followed by a  sentinel lymph node procedure in December of 2003 with one node negative and  a second node showing a microscopic focus of disease.  The tumor was ER and  PR negative and HER-2 neu negative.  She started treatment in January 2004  with adriamycin, Taxotere, and Cytoxan chemotherapy.  To date she has  completed six of six cycles receiving her most recent cycle on May 04, 2003.  She received a Neulasta injection on May 05, 2003.   The patient presented to the office on May 11, 2003 for routine laboratory  work with CBC showing a hemoglobin of 10.6, white blood cell count 0.4, ANC  0.05, and platelet count 53,000.  The patient returned home following her  laboratory work and called back to inform the office that she was having  shaking chills.  She was instructed  to return to the [facility].  We  obtained blood cultures in the office and patient also received a dose of  Rocephin.  At that point we elected to proceed with admission for further  evaluation and continued IV antibiotics.   HOSPITAL COURSE:  The patient's vital signs upon arrival to the inpatient  unit showed a temperature of 98.4, heart rate 84, respirations 20, blood  pressure 120/76.  In addition to the above cultures which were obtained in  the office, urinalysis and urine culture were also obtained as well as a  throat culture and chest x-ray.  The patient was started on IV antibiotics  with Primaxin 500 mg IV q.6h.  The Primaxin was continued throughout her  hospitalization.  The blood cultures, urine culture, and drug culture all  remained negative.  The chest x-ray on May 11, 2003 showed no active  disease.  The patient remained afebrile throughout her hospitalization.  Her  blood counts were followed closely with hemoglobin of 8.4 on May 12, 2003  and 8.6 on May 13, 2003.  Evidence of recovery of her white blood cell count  was noted on May 12, 2003 at which time it was 1.4.  By day of discharge,  May 13, 2003, the white blood cell count was 3.2 with an absolute neutrophil  count of 1.7.  The patient's platelet count was 53,000 on admission and  declined to a low of 34,000 on May 12, 2003 and was 37,000 on May 13, 2003.  She had no bleeding.  The patient will have laboratory work at the office on  May 14, 2003 to follow up these counts.   The patient did report several loose stools on admission.  This was treated  with Lomotil with resolution.  She had also experienced diarrhea with her  prior cycles of chemotherapy.   On May 12, 2003 patient was noted to be mildly hypokalemic with a potassium  level of 3.3.  20 mEq of potassium was added to her IV fluids with  normalization of her potassium level on May 13, 2003 at 3.9.   On May 13, 2003 patient was felt to be stable  for discharge home with follow-  up laboratory work in the office on May 14, 2003 and an office visit with  Genene Churn. Cyndie Chime, M.D. on May 18, 2003.   LABORATORY DATA:  May 13, 2003:  Hemoglobin 8.6, white count 3.2, absolute  neutrophil count 1.7, platelet count 37,000.  Sodium 142, potassium 3.9,  chloride 109, CO2 29, glucose 82, BUN 3, creatinine 0.5, calcium 8.8.   Radiology:  Chest x-ray May 11, 2003:  No infiltrates.   DISPOSITION:  1. Condition:  Stable.  2. Activity as tolerated.  3. Diet no restrictions.  4. Wound care:  Routine care of Port-A-Cath.  5. Special instructions:  The patient was instructed to call for fever     greater or equal to 101 degrees, chills, bleeding, shortness of breath,     or any other problems.  6. Follow-up:     a. Laboratory work on May 14, 2003 at [facility].     b. Keep scheduled appointment with Genene Churn. Cyndie Chime, M.D. on May 18, 2003.  7. Discharge medications:     a. Celexa 20 mg daily.     b. Synthroid 100 mcg daily.     c. Prednisone 7 mg daily.     d. Flonase two sprays at bedtime.     e. Protonix 40 mg daily.     f. Ativan 0.5-1 mg q.6h. as needed.     g. Vicodin one to two tablets q.4-6h. as needed.     h. Zofran 8 mg q.8h. as needed.     Lonna Cobb, N.P.                         Leighton Roach. Truett Perna, M.D.    LT/MEDQ  D:  05/19/2003  T:  05/19/2003  Job:  [id number]   cc:   Genene Churn. Cyndie Chime, M.D.  [address] [facility]  [place]  [place] [address]  Fax: [phone]

## 2011-05-11 NOTE — Op Note (Signed)
[facility]  Patient:    Carolyn Garcia, Carolyn Garcia                   MRN: [id number] Proc. Date: 01/07/01 Adm. Date:  [id number] Attending:  Kandis Mannan CC:         Jenel Lucks, M.D.  Lemmie Evens, M.D.   Operative Report  CCS [phone].  PREOPERATIVE DIAGNOSIS:  Enlarged right lobe of thyroid with pressure symptoms, with history of thyroiditis.  POSTOPERATIVE DIAGNOSIS:  Enlarged right lobe of thyroid with pressure symptoms, with history of thyroiditis.  PROCEDURE:  Near-total right thyroid lobectomy.  SURGEON:  Maisie Fus B. Samuella Cota, M.D.  ASSISTANT:  Milus Mallick, M.D.  ANESTHESIA:  General, anesthesiologist and C.R.N.A.  DESCRIPTION OF PROCEDURE:  The patient was taken to the operating room and placed on the table in supine position, and after satisfactory general anesthetic with intubation, a sandbag was placed transversely beneath the shoulders and the patient placed in the thyroid position.  The neck was then prepped and draped in a sterile field.  The patient has had a previous thyroid operation in 1987, and the old thyroid incision was used.  No attempt was made to remove the old scar.  Incision was taken through skin and subcutaneous tissue to the platysma muscle.  Flaps were then developed superiorly and individually and the Mahorner retractor placed.  The patient had had her left lobe of the thyroid previously removed.  The right lobe was quite large, deviating the trachea and esophagus to the patients left side.  The midline strap muscles were divided and were quite adherent to the thyroid gland.  The muscle was finally taken off and the gland exposed.  Dissection was carried out inferiorly with very tedious dissection.  The gland was consistent with thyroiditis, with rather marked adhesions to the surrounding tissue.  The superior thyroid vessels were dissected free.  The main artery and vein were doubly ligated with 2-0  black silk.  There were two or three other small vessels, which were also ligated with 2-0 black silk.  The lateral thyroid vein was divided to give better exposure.  After some tedious dissection, the recurrent laryngeal nerve on the right side was seen.  The gland was removed, leaving perhaps 5% because of the difficulty of the dissection.  It was thought that we saw the right inferior parathyroid gland.  We were not sure that we were seeing a superior parathyroid gland on the right side, but we did not remove anything that looked like parathyroid tissue.  Dissection was quite tedious with a portion of the gland being left posteriorly near the recurrent laryngeal nerve.  The gland was divided with bleeders ligated with 3-0 black silk.  The near-total dissection was completed and thyroid gland removed.  The wound was copiously irrigated with saline, hemostasis was obtained.  Because patient had been somewhat oozy during the procedure, a piece of Surgicel was placed in the right neck in the tracheoesophageal groove and over the thyroid tissue that was remaining.  The recurrent laryngeal nerve was felt to have been protected, and we probably saw at least one parathyroid gland.  Previous surgery had identified the left recurrent laryngeal and two parathyroid glands.  Midline strap muscles were closed with interrupted sutures of 3-0 Vicryl, subcutaneous tissue closed with 3-0 Vicryl, and the skin was closed with interrupted vertical mattress sutures of 4-0 nylon and was dressed with benzoin and quarter-inch Steri-Strips.  Dry sterile dressing  was applied.  The patient seemed to tolerate the procedure well, was taken to the PACU in satisfactory condition. DD:  01/07/01 TD:  01/07/01 Job: [id number]

## 2011-05-11 NOTE — H&P (Signed)
NAME:  Carolyn Garcia, Carolyn Garcia                      ACCOUNT NO.:  [id number]   MEDICAL RECORD NO.:  [id number]                   PATIENT TYPE:  INP   LOCATION:  0473                                 FACILITY:  [facility]   PHYSICIAN:  Pierce Crane, M.D.                   DATE OF BIRTH:  January 31, 1966   DATE OF ADMISSION:  01/27/2003  DATE OF DISCHARGE:                                HISTORY & PHYSICAL   IDENTIFICATION:  The patient is a 45 year old woman from [place].   HISTORY OF PRESENT ILLNESS:  This woman was diagnosed with breast cancer on  December 07, 2002.  She underwent lumpectomy with lymph node evaluation for  her breast mass.  It was found to be a 2.5-cm ER-negative/PR-negative HER2-  positive breast cancer.  Sentinel lymph node was negative.  She had a Port-A-  Cath placed on January 18, 2003 and started chemotherapy eight days ago with  [facility] chemotherapy.  Her Port-A-Cath has been painful since its placement.  Since her initiation of chemotherapy, she initially had done well and then  about two to three days after chemotherapy, started having nausea, which has  persisted.  Today, she woke up and had multiple bowel movements with some  urgency but no incontinence and some vague crampy lower abdominal pain; she  has been nauseated as well.  She stated throughout the day she was having  some chills and noted later on this evening having some rising temperatures  of 99.5 and 100.1; she was subsequently seen in the emergency room.  She has  been found to be neutropenic.   She denies any other symptoms in terms of respiratory symptoms, i.e., cough.  She denies any dysuria.   PAST MEDICAL HISTORY:  Past medical history is significant for rheumatoid  arthritis affecting hands, knees and feet, diagnosed in 1992.  She had been  on methotrexate up until three to four months ago and has been on  prednisone.  She has been trying to conceive.  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  Also is on Vicodin for pain relief through her Port-A-Cath.  She has been on  continuous prednisone, now on 7 mg per day for the past three to four  months.   PAST SURGICAL HISTORY:  Past surgical history includes history of  tonsillectomy, history of thyroidectomy and gallbladder surgery.   SOCIAL HISTORY:  She is married.  She and her husband have no children.  She  is apparently on disability secondary to rheumatoid arthritis.   FAMILY HISTORY:  She has one brother who is in good health.  There is no  history of cancer in the family.   HABITS:  She is a nonsmoker and non-alcohol consumer.  ALLERGIES:  None.   REVIEW OF SYSTEMS:  Denies any headaches or blurred vision.  Does have  weakness, fatigue, dry mouth, pain around the port site and nausea,  complaining of diarrhea.   PHYSICAL EXAMINATION:  GENERAL:  Physical examination reveals a pleasant  woman looking her stated age in no obvious distress.  VITAL SIGNS:  Blood pressure 99/65, heart rate 96, respiratory rate 20,  temperature 99.4, O2 saturation 98% on room air.  HEENT:  No thrush on oropharyngeal examination.  NODES:  No cervical or regional adenopathy.  LUNGS:  Lungs are clear.  CARDIAC:  Exam within normal limits.  CHEST:  Port site in the right anterior chest; site itself is slightly  tender but no fluctuance is appreciated.  Port seems to be functioning well.  BREASTS:  She is status post lumpectomy and sentinel lymph node evaluation.  No obvious breast masses are appreciated.  ABDOMEN:  Bowel sounds are faint though present and there is no obvious  tenderness on abdominal examination, no rebound.  She has no inguinal  adenopathy.  EXTREMITIES:  No peripheral cyanosis, clubbing or edema.   LABORATORY STUDIES:   CBC:  White count 0.5, hemoglobin 0.7, platelet count  96,000.  Potassium 3.4, creatinine 0.6.   Chest x-ray does not show any obvious airspace disease.  Urinalysis is  pending.   IMPRESSION AND PLAN:  The patient is a pleasant woman who presents with  above neutropenia secondary to [facility] chemotherapy.  She will be placed on  broad-spectrum antibiotics including cefepime and vancomycin.  She will be  started on Neupogen as well.  She has been on chronic prednisone and we will  increase her steroid doses for stress dose to cover her during this period  of time.  She will be monitored carefully in the hospital.  Her stools will  be checked for Clostridium difficile.                                               Pierce Crane, M.D.    PR/MEDQ  D:  01/27/2003  T:  01/28/2003  Job:  [id number]   cc:   Thomas B. Samuella Cota, M.D.  [address], [address]  [place]  [place] [address]  Fax: [phone]   Genene Churn. Cyndie Chime, M.D.  [address] [facility]  [place]  [place] [address]  Fax: [phone]

## 2011-05-11 NOTE — H&P (Signed)
NAME:  Carolyn Garcia, Carolyn Garcia                      ACCOUNT NO.:  [id number]   MEDICAL RECORD NO.:  [id number]                   PATIENT TYPE:  INP   LOCATION:  0260                                 FACILITY:  [facility]   PHYSICIAN:  Valentino Hue. Magrinat, M.D.            DATE OF BIRTH:  1966/06/17   DATE OF ADMISSION:  DATE OF DISCHARGE:                                HISTORY & PHYSICAL   CHIEF COMPLAINT:  Chills, possible fever, in a patient who is neutropenic.   HISTORY OF PRESENT ILLNESS:  The patient is a 45 year old woman with stage  II breast cancer diagnosed in November 2003.  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  This was followed by a  sentinel lymph node procedure in December of 2003 with one node negative and  a second node showing a microscopic  focus of disease.  The tumor was ER/PR  negative and HER-2 new negative.   The patient started treatment in January of 2004 with  Adriamycin/Taxotere/Cytoxan chemotherapy.  She has now completed six of six  cycles.  The most recent cycle was given on May 04, 2003 and was followed by  a Neulasta injection on May 05, 2003.  She required admission in February of  2004 following her first cycle with fever and neutropenia.   The patient presented to the office on May 11, 2003 for routine lab work and  was found to have a hemoglobin of 10.6, white blood cell count 0.4, ANC  0.05, and a platelet count of 53,000.  She went home following the lab work  and called back to say that she was having shaking chills and did not feel  well.  The patient returned to the cancer clinic for further evaluation.  Two sets of blood cultures were obtained in the office and the patient  received a dose of Rocephin intravenously.  Following this, we elected to  proceed with a full admission.   PAST MEDICAL HISTORY:  1. Breast cancer as above.  2.  Rheumatoid arthritis.  3. Hypothyroidism following surgery for a goiter, currently on Synthroid.  4. Reflux currently on Protonix.  5. Status post laparoscopic cholecystectomy.  6. Status post tonsillectomy.  7. Status post liver biopsy.   HOME MEDICATIONS:  1. Celexa 20 mg daily.  2. Synthroid 100 mcg daily.  3. Prednisone 7 mg daily.  4. Protonix 40 mg daily.  5. Flonase 2 sprays q.h.s.  6. Ativan p.r.n.  7. Vicodin p.r.n.  8. Zofran p.r.n.  9. Lomotil p.r.n.   ALLERGIES:  1. TYLOX.  2. ENTEX LA.   FAMILY HISTORY:  Negative for breast cancer.   SOCIAL HISTORY:  The patient lives in [place].  She is married.  She has  no children.  She has no history of ETOH or tobacco use.   REVIEW OF SYSTEMS:  The patient  reports shaking chills earlier today.  She  has not had a documented fever but reports that she felt as if her  temperature were elevated.  She has had a sore throat and mouth pain.  She  has had these symptoms with her prior cycles but they are worse than normal.  She has had no shortness of breath or cough.  She denies any chest pain.  She denies any peripheral edema.  She was having diarrhea earlier today and  took a Lomotil.  She has had diarrhea with prior cycles also.  She denies  any hematuria or dysuria.  She has had some nasal congestion.   PHYSICAL EXAMINATION:  VITAL SIGNS:  Temperature 98.1, heart rate 86,  respirations 16, blood pressure 109/67.  GENERAL:  A pleasant Caucasian female in no acute distress.  HEENT:  Normocephalic atraumatic.  Scalp alopecia.  Pupils equal and  reactive to light.  Extraocular muscles are intact.  Sclerae anicteric.  Posterior pharynx with cobblestone appearance.  No thrush or ulcers.  CHEST:  Clear bilaterally.  Port-A-Catheter site is nontender and without  erythema.  CARDIOVASCULAR:  Regular rate and rhythm.  ABDOMEN:  Soft and nontender.  EXTREMITIES:  No clubbing, cyanosis, or edema.  NEUROLOGIC:  Alert and  oriented.  Motor strength is 5/5.  Knee DTRs are 2+  and symmetrical.   LABORATORY DATA:  Hemoglobin 10.6, white count 0.4, ANC 0.05, platelets  53,000.   IMPRESSION/PLAN:  The patient is a 45 year old [place] woman being  treated for breast cancer with PAC, now day eight from cycle six, admitted  with fever and neutropenia.  The plan is for Primaxin pending cultures  (drawn in the office).  She received Neulasta seven days ago.   The patient was seen and examined by Dr. Darnelle Catalan.     Lonna Cobb, N.P.                         Valentino Hue. Magrinat, M.D.    LT/MEDQ  D:  05/12/2003  T:  05/12/2003  Job:  [id number]

## 2011-05-11 NOTE — Op Note (Signed)
NAME:  Carolyn Garcia, Carolyn Garcia                      ACCOUNT NO.:  [id number]   MEDICAL RECORD NO.:  [id number]                   PATIENT TYPE:  OUT   LOCATION:  XRAY                                 FACILITY:  [facility]   PHYSICIAN:  Donnie Coffin. Samuella Cota, M.D.               DATE OF BIRTH:  1966-11-22   DATE OF PROCEDURE:  01/18/2003  DATE OF DISCHARGE:  01/14/2003                                 OPERATIVE REPORT   PREOPERATIVE DIAGNOSIS:  Carcinoma of the breast, needs intravenous access  for chemotherapy.   POSTOPERATIVE DIAGNOSIS:  Carcinoma of the breast, needs intravenous access  for chemotherapy.   OPERATION:  Insertion of Baird port catheter via right subclavian vein.   SURGEONS:  Maisie Fus B. Samuella Cota, M.D. and Currie Paris, M.D.   ANESTHESIA:  1% Xylocaine local with anesthesia monitoring, anesthesiologist  Janetta Hora. Gelene Mink, M.D. and CRNA.   DESCRIPTION OF PROCEDURE:  The patient was brought to the operating room and  placed in the supine position on the operating table. A rolled up sheet was  placed vertically along the thoracic spines. The neck and chest area were  prepped bilaterally. Then 1% Xylocaine local was used to infiltrate the skin  beneath the right clavicle.   An 18 gauge needle was used to attempt puncture of the right subclavian  vein. After several attempts, I was unsuccessful and I asked Dr. Cicero Duck if he would assist me. After several attempts, the vein was entered  but the guide wire would not thread and then another stick was done and the  guide wire threaded totally by fluoroscopy.   A small area of the right chest was then anesthetized. A transverse incision  was made and a subcutaneous pocket inferior to this was created. From this  area to the area of the subclavian stick, the [facility] port catheter was  passed. The dilator was placed over the guide wire and then the dilator and  the peel-away sheath were placed over the guide wire and the guide wire  and  dilator removed, leaving just the peel-away sheath. The catheter which had  been filled with heparinized saline was then passed through the peel-away  sheath and the peel-away sheath pulled away.   Using fluoroscopy the catheter was pulled back up into the superior vena  cava. The catheter was then cut off at the correct length and was then  attached to the [place] port reservoir which had been previously filled with  heparinized saline. The small attachment was used to secure the catheter to  the reservoir. The reservoir was sutured into the subcutaneous pocket with 2  sutures of 3-0 Vicryl. Using fluoroscopy again the catheter was in good  position with no kinks in the system.   The subcutaneous tissue was closed with 3-0 Vicryl and the skin was closed  in a running subcuticular suture of 4-0 Monocryl. A single 4-0 Monocryl  suture was  placed at the infraclavicular site.   The IV extension tubing with the right-angle Huber needle was then passed  through the skin into the reservoir. Blood easily aspirated back through the  system and then the entire system was filled with 5 cc of heparin 100  units/cc. This was capped off.   The incisions were painted with Benzoin and 1/2 inch Steri-Strips were  placed to reinforce the skin closure. Dry sterile dressings were applied.   The patient seemed to tolerate the procedure well. She was taken to the PACU  in satisfactory condition.                                               Thomas B. Samuella Cota, M.D.    TBP/MEDQ  D:  01/18/2003  T:  01/18/2003  Job:  [id number]

## 2011-05-11 NOTE — Op Note (Signed)
NAME:  Carolyn Garcia, Carolyn Garcia                      ACCOUNT NO.:  [id number]   MEDICAL RECORD NO.:  [id number]                   PATIENT TYPE:  AMB   LOCATION:  DSC                                  FACILITY:  [facility]   PHYSICIAN:  Maisie Fus B. Samuella Cota, M.D.               DATE OF BIRTH:  08-06-1966   DATE OF PROCEDURE:  12/22/2002  DATE OF DISCHARGE:                                 OPERATIVE REPORT   CCS# [id number]   PREOPERATIVE DIAGNOSIS:  Carcinoma of the left breast.   POSTOPERATIVE DIAGNOSIS:  Carcinoma of the left breast.   OPERATION PERFORMED:  Sentinel lymph node biopsy, left axilla.   SURGEON:  Maisie Fus B. Samuella Cota, M.D.   ANESTHESIA:  General.   ANESTHESIOLOGIST:  Bedelia Person, M.D. and CRNA.   DESCRIPTION OF PROCEDURE:  The patient was taken to the operating room and  placed on the table in the supine position.  After satisfactory general  anesthetic with LMA intubation, approximately 3 cc of isosulfan blue was  injected into the left breast just lateral to a curved incision in the upper  outer quadrant.  This was then massaged for about five minutes.  The patient  had previously been injected with sulfur colloid.  The breast and left  axilla was then prepped and draped as a sterile field.  The gamma probe was  placed over the axilla and there was a hot spot in the lower axilla.  A  transverse incision was made in the low axilla through skin and subcutaneous  tissue.  Dissection revealed blue lymphatics going to a lymph node which was  dissected free.  It was hot on the gamma probe registering about 340.  This  was dissected free with some tiny lymphatics being clipped with tiny  Hemoclips.  After this node was removed, the gamma probe was placed in the  wound.  There was a second node which was also hot.  This was dissected and  was noted to have only a minimal amount of blue.  Both of these nodes were  of fairly good size, 1.5 cm probably in size but looked benign.  This node  was also  dissected with clips being applied to the lymphatics.  This  registered about 191 on the gamma probe.  The probe was then placed in the  wound.  There were no other hot spots and by palpation, there were no  enlarged lymph nodes which I could feel.  The two specimens were sent to the  pathologist.  The wound was packed while we waited and then I closed the  wound with interrupted sutures of 3-0 Vicryl followed by a running  subcuticular suture of 4-0 Vicryl.  Marcie Bal, M.D., pathologist,  reported that on preliminary study, both of these lymph nodes showed no  evidence of malignancy.  Following this, benzoin and half-inch Steri-Strips  were placed to the wound and a pressure dressing  using 4 x 4s, ABD and 4  inch HypaFix was applied.  The patient seemed to tolerate the procedure well  and was taken to the PACU in satisfactory condition.                                                Thomas B. Samuella Cota, M.D.    TBP/MEDQ  D:  12/22/2002  T:  12/22/2002  Job:  [id number]   cc:   Genene Churn. Cyndie Chime, M.D.  [address] [facility]  [place]  [place] [address]  Fax: [phone]   Malva Limes, M.D.  [address], [address]  [place]  [place] [address]  Fax: [phone]   Lemmie Evens, M.D.  [address]  [place]  [place] [address]  Fax: [phone]

## 2011-05-11 NOTE — Op Note (Signed)
NAME:  Carolyn Garcia, FOSKEY                      ACCOUNT NO.:  [id number]   MEDICAL RECORD NO.:  [id number]                   PATIENT TYPE:  AMB   LOCATION:  DSC                                  FACILITY:  [facility]   PHYSICIAN:  Maisie Fus B. Samuella Cota, M.D.               DATE OF BIRTH:  June 25, 1966   DATE OF PROCEDURE:  12/02/2002  DATE OF DISCHARGE:                                 OPERATIVE REPORT   CCS# [id number]   PREOPERATIVE DIAGNOSIS:  Granular cell tumor, left breast.   POSTOPERATIVE DIAGNOSIS:  Granular cell tumor, left breast.   OPERATION PERFORMED:  Excision of granular cell tumor, left breast.   SURGEON:  Maisie Fus B. Samuella Cota, M.D.   ANESTHESIA:  Local (1% Xylocaine without epinephrine), anesthesiologist and  CRNA Elliot Dally.   DESCRIPTION OF PROCEDURE:  The patient was taken to the operating room and  placed on the table in supine position.  The left breast was prepped and  draped as a sterile field.  The patient had about a 2.5 cm palpable mass in  the 1o'clock position of the left breast.  Just lateral to this was some  thickening of some tissue which the patient was concerned about and wanted  to remove.  A curved transverse type incision was outlined with a skin  marker, local anesthetic was used to infiltrate the skin and underlying  breast tissue and used as necessary.  The incision was made and feeling the  mass, dissection was carried out to try to get a wide resection of this  tumor.  Most of the dissection was done with the scissors.  The patient was  quite vascular.  Some thickened  breast tissue towards the axillary tail was  removed along with the specimen.  At no time did it appear that I was  cutting across tumor.  The specimen was marked superficial with two short  sutures.  The deep margin was two long sutures and the superior margin with  a short and long suture.  It was sent fresh but no frozen section requested.  Bleeding was controlled with the cautery.   Subcutaneous tissue closed with 3-  0 Vicryl and skin was closed with running subcuticular suture of 4-0  Monocryl.  Benzoin and half inch Steri-Strips were used to reinforce the  skin closure.  Dry sterile dressings using 4 x 4s, 2  ABDs, and 4-inch HypaFix was applied.  The patient seemed to tolerate the  procedure well and was taken to the recovery room in satisfactory condition.                                                Thomas B. Samuella Cota, M.D.    TBP/MEDQ  D:  12/02/2002  T:  12/02/2002  Job:  [id number]  cc:   Malva Limes, M.D.  [address], [address]  [place]  [place] [address]  Fax: [phone]   Lemmie Evens, M.D.  [address]  [place]  [place] [address]  Fax: [phone]

## 2011-05-11 NOTE — Op Note (Signed)
   NAME:  Carolyn Garcia, Carolyn Garcia                      ACCOUNT NO.:  [id number]   MEDICAL RECORD NO.:  [id number]                   PATIENT TYPE:  AMB   LOCATION:  DSC                                  FACILITY:  [facility]   PHYSICIAN:  Maisie Fus B. Samuella Cota, M.D.               DATE OF BIRTH:  1966/09/15   DATE OF PROCEDURE:  05/28/2003  DATE OF DISCHARGE:                                 OPERATIVE REPORT   CCS NUMBER:  [id number]   PREOPERATIVE DIAGNOSIS:  Desires removal of Port-A-Cath, right  infraclavicular area.   POSTOPERATIVE DIAGNOSIS:  Desires removal of Port-A-Cath, right  infraclavicular area.   OPERATION:  Removal of Port-A-Cath.   SURGEON:  Maisie Fus B. Samuella Cota, M.D.   ANESTHESIA:  1% Xylocaine local.   PROCEDURE:  The patient was taken to the minor surgery room where the right  infraclavicular area was prepped and draped as a sterile field.  The patient  had a transverse incision from her Port-A-Cath placement.  The area was  anesthetized with 1% Xylocaine local, and the incision was made through the  old incision with no attempt made to remove the scar.  The Port-A-Cath was  easily encountered, and the catheter was dissected free and removed from the  right subclavian vein.  Pressure was held over the area for perhaps 3-4  minutes.  The reservoir was then freed and easily delivered.  The wound was  then closed in two layers using 4-0 Monocryl for the subcuticular layer, and  then a subcuticular running suture of 4-0 Monocryl was used to close the  skin.  Benzoin and 1/2-inch Steri-Strips were used to reinforce the skin  closure.  Dry sterile dressing was applied.  The patient seemed to tolerate  the procedure well and was taken to the PACU in satisfactory condition.                                               Thomas B. Samuella Cota, M.D.    TBP/MEDQ  D:  05/28/2003  T:  05/29/2003  Job:  [id number]

## 2011-05-11 NOTE — Discharge Summary (Signed)
NAME:  Carolyn Garcia, Carolyn Garcia                      ACCOUNT NO.:  [id number]   MEDICAL RECORD NO.:  [id number]                   PATIENT TYPE:  INP   LOCATION:  0271                                 FACILITY:  [facility]   PHYSICIAN:  Genene Churn. Cyndie Chime, M.D.          DATE OF BIRTH:  1966-04-23   DATE OF ADMISSION:  01/27/2003  DATE OF DISCHARGE:  01/31/2003                                 DISCHARGE SUMMARY   HISTORY OF PRESENT ILLNESS:  The patient is a pleasant 45 year old woman  with longstanding rheumatoid arthritis on weekly methotrexate and low-dose  oral prednisone.  She recently was found to have a mass in her left breast.  Unfortunately this was malignant. Primary tumor was 2.5 cm.  A single lymph  node had a microscopic focus of disease less than 2 mm and is considered  negative by the new staging system.  I believe she was estrogen-receptor  positive.  She was recently started on chemotherapy with adriamycin,  Cytoxan, and Taxotere and received her first treatment eight days prior to  admission.  She presented with a two to three-day history of nausea,  profound weakness, fatigue, crampy abdominal pain,and low-grade fevers.  She  was admitted for further evaluation.   On initial exam by Dr. Pierce Crane, she had a fever of 100.5 degrees, pulse  118 and regular, respirations 24, blood pressure 99/65.  Pertinent physical  findings included tenderness but no erythema or exudate at site of recent  right subclavian Port-A-Cath infusion device, surgical changes in the left  breast, clear lungs, regular cardiac rhythm, abdomen soft and nontender.  No  CVA tenderness.   LABORATORY DATABASE:  Significant leukopenia from recent chemotherapy with  total white count only 0.5, hemoglobin 11.7, platelet count 96,000 with  subsequent fall with platelets down to 83,000.   Chest radiograph showed no infiltrate or effusion.   Chemistry profile showed a potassium of 3.4, otherwise normal.   HOSPITAL COURSE:  Cultures were obtained, and she was started on broad-  spectrum parenteral antibiotics.  She was started on Neupogen growth factors  to accelerate white blood count recovery.  Fevers resolved rapidly.  Cultures all remained negative at 72 hours.  White count began to rise in  response to the hormone injection, and total white count was fully recovered  at 8600 at time of discharge on February 8.  Hemoglobin 10 and platelets  82,000 at that time.   On the second hospital day, she developed acute onset of severe lumbar pain.  She had never experienced this kind of pain or in this location with  previous rheumatoid arthritis.  There were no obvious findings on physical  exam, no significant point tenderness over the spine or paraspinal muscles.  Kernig and Brudzinski signs were normal.  Motor strength was normal and  reflexes intact.  Regular x-rays of the spine showed no abnormalities.  She  was put on muscle relaxants with some relief, but  due to persistent  symptoms, a MRI of the lumbosacral spine was done.  Official report has not  yet returned to the chart.  However, I discussed the results with the  radiologist on day of discharge, and there were no abnormalities on the scan  to suggest involvement with tumor or any cord impingement.  Pain did come  under control with the combination of Vicodin, Lorazepam, and diazepam.   CONSULTATIONS:  None.   PROCEDURES:  MRI of the lumbosacral spine.   DIAGNOSES:  1. Fever and neutropenia secondary to chemotherapy.  2. Stage II, T2, N0 breast cancer.  3. Back pain due to muscle spasm.  4. Rheumatoid arthritis.   DISPOSITION:  Condition is stable at time of discharge.  Resume regular  activity, regular diet.  Follow up in my office in four days.   DISCHARGE MEDICATIONS:  1. Vicodin 1 to 2 q.4h. p.r.n. pain.  2. Lorazepam 2 mg t.i.d. and q.4h. p.r.n. muscle spasm.  3. Coumadin 1 mg daily Port-A-Cath prophylaxis.  4.  Synthroid 0.125 mg daily.  5. Celexa 20 mg daily.  6. Prednisone 7 mg daily.  7. Levaquin 500 mg daily for 4 days.                                               Genene Churn. Cyndie Chime, M.D.    Lottie Rater  D:  02/01/2003  T:  02/01/2003  Job:  [id number]   cc:   Thomas B. Samuella Cota, M.D.  [address]  [place]  [place] [address]  Fax: [phone]

## 2011-05-11 NOTE — Discharge Summary (Signed)
   NAME:  Carolyn Garcia, Carolyn Garcia                      ACCOUNT NO.:  [id number]   MEDICAL RECORD NO.:  [id number]                   PATIENT TYPE:  INP   LOCATION:  0260                                 FACILITY:  [facility]   PHYSICIAN:  Valentino Hue. Magrinat, M.D.            DATE OF BIRTH:  Aug 25, 1966   DATE OF ADMISSION:  05/11/2003  DATE OF DISCHARGE:  05/13/2003                                 DISCHARGE SUMMARY   DISCHARGE DIAGNOSES:  1. Fever with neutropenia.  2. Breast cancer.  3. Rheumatoid arthritis.  4. Hypothyroidism following surgery for goiter.  5. Reflux, on Protonix.  6. History of laparoscopic cholecystectomy.  7. Status post tonsillectomy.  8. Status post liver biopsy.   PROCEDURES:  Intravenous antibiotics.   HOSPITAL COURSE:  The patient was admitted with a temperature in excess of  101 degrees and a total white cell count of 1.4.  She was started on  Primaxin and pancultured.  Within 24 hours, the patient's fever had abated.  Her counts had significantly improved, and it was felt safe to discharge her  home.  Cultures obtained from blood and urine remain negative at the time of  this dictation.   Chest x-ray showed no acute disease.   CONDITION AT DISCHARGE:  Improved.   DISCHARGE MEDICATIONS:  1. She will be on Celexa 20 mg daily.  2. Synthroid 100 mcg daily.  3. Prednisone 7 mg daily.  4. Flonase 2 sprays at bedtime.  5. Protonix 40 mg daily.  6. Ativan 0.5-1 mg every 6 hours as needed.  7. Vicodin 1-2 tablets every 4-6 hours as needed.  8. Zofran 8 mg every 8 hours as needed.  9. For pain, she will be using Vicodin as above.   ACTIVITIES:  As tolerated.   DIET:  Unrestricted.   WOUND CARE:  Routine care of the Port-A-Cath.   SPECIAL INSTRUCTIONS:  She will call for fever of greater than 101, chills,  bleeding, shortness of breath or any other problems.   FOLLOW UP:  She will be seen in the office on 05/14/2003 for lab work, and  she will see Dr.  Cyndie Chime on 05/18/2003 for consideration of further  treatment.                                               Valentino Hue. Magrinat, M.D.    Ronna Polio  D:  05/20/2003  T:  05/20/2003  Job:  [id number]   cc:   Genene Churn. Cyndie Chime, M.D.  [address] [facility]  [place]  [place] [address]  Fax: [phone]

## 2011-05-24 ENCOUNTER — Ambulatory Visit (INDEPENDENT_AMBULATORY_CARE_PROVIDER_SITE_OTHER): Payer: Medicare Other | Admitting: Psychiatry

## 2011-05-24 DIAGNOSIS — F3289 Other specified depressive episodes: Secondary | ICD-10-CM

## 2011-05-24 DIAGNOSIS — F329 Major depressive disorder, single episode, unspecified: Secondary | ICD-10-CM

## 2011-05-31 ENCOUNTER — Ambulatory Visit: Payer: Medicare Other | Admitting: Psychiatry

## 2011-06-07 ENCOUNTER — Ambulatory Visit (INDEPENDENT_AMBULATORY_CARE_PROVIDER_SITE_OTHER): Payer: Medicare Other | Admitting: Psychiatry

## 2011-06-07 DIAGNOSIS — F0630 Mood disorder due to known physiological condition, unspecified: Secondary | ICD-10-CM

## 2011-06-07 DIAGNOSIS — F329 Major depressive disorder, single episode, unspecified: Secondary | ICD-10-CM

## 2011-06-07 DIAGNOSIS — F3289 Other specified depressive episodes: Secondary | ICD-10-CM

## 2011-06-14 ENCOUNTER — Ambulatory Visit (INDEPENDENT_AMBULATORY_CARE_PROVIDER_SITE_OTHER): Payer: Medicare Other | Admitting: Psychiatry

## 2011-06-14 DIAGNOSIS — F0630 Mood disorder due to known physiological condition, unspecified: Secondary | ICD-10-CM

## 2011-06-14 DIAGNOSIS — F3289 Other specified depressive episodes: Secondary | ICD-10-CM

## 2011-06-14 DIAGNOSIS — F329 Major depressive disorder, single episode, unspecified: Secondary | ICD-10-CM

## 2011-06-28 ENCOUNTER — Ambulatory Visit (INDEPENDENT_AMBULATORY_CARE_PROVIDER_SITE_OTHER): Payer: Medicare Other | Admitting: Psychiatry

## 2011-06-28 DIAGNOSIS — F0630 Mood disorder due to known physiological condition, unspecified: Secondary | ICD-10-CM

## 2011-06-28 DIAGNOSIS — F329 Major depressive disorder, single episode, unspecified: Secondary | ICD-10-CM

## 2011-06-28 DIAGNOSIS — F3289 Other specified depressive episodes: Secondary | ICD-10-CM

## 2011-07-05 ENCOUNTER — Ambulatory Visit (INDEPENDENT_AMBULATORY_CARE_PROVIDER_SITE_OTHER): Payer: Medicare Other | Admitting: Psychiatry

## 2011-07-05 DIAGNOSIS — F0630 Mood disorder due to known physiological condition, unspecified: Secondary | ICD-10-CM

## 2011-07-12 ENCOUNTER — Other Ambulatory Visit: Payer: Self-pay | Admitting: Oncology

## 2011-07-12 ENCOUNTER — Encounter (HOSPITAL_BASED_OUTPATIENT_CLINIC_OR_DEPARTMENT_OTHER): Payer: Medicare Other | Admitting: Oncology

## 2011-07-12 DIAGNOSIS — Z853 Personal history of malignant neoplasm of breast: Secondary | ICD-10-CM

## 2011-07-12 LAB — CBC WITH DIFFERENTIAL/PLATELET
BASO%: 0.2 % (ref 0.0–2.0)
Eosinophils Absolute: 0.1 10*3/uL (ref 0.0–0.5)
MCHC: 33.7 g/dL (ref 31.5–36.0)
MONO#: 0.4 10*3/uL (ref 0.1–0.9)
NEUT#: 2.4 10*3/uL (ref 1.5–6.5)
RBC: 4.39 10*6/uL (ref 3.70–5.45)
RDW: 12.2 % (ref 11.2–14.5)
WBC: 4.7 10*3/uL (ref 3.9–10.3)
lymph#: 1.9 10*3/uL (ref 0.9–3.3)

## 2011-07-12 LAB — COMPREHENSIVE METABOLIC PANEL
ALT: 17 U/L (ref 0–35)
Albumin: 4.1 g/dL (ref 3.5–5.2)
CO2: 30 mEq/L (ref 19–32)
Calcium: 10 mg/dL (ref 8.4–10.5)
Chloride: 101 mEq/L (ref 96–112)
Glucose, Bld: 76 mg/dL (ref 70–99)
Potassium: 3.4 mEq/L — ABNORMAL LOW (ref 3.5–5.3)
Sodium: 138 mEq/L (ref 135–145)
Total Bilirubin: 0.4 mg/dL (ref 0.3–1.2)
Total Protein: 7.6 g/dL (ref 6.0–8.3)

## 2011-07-12 LAB — LACTATE DEHYDROGENASE: LDH: 172 U/L (ref 94–250)

## 2011-07-19 ENCOUNTER — Ambulatory Visit (INDEPENDENT_AMBULATORY_CARE_PROVIDER_SITE_OTHER): Payer: Medicare Other | Admitting: Psychiatry

## 2011-07-19 DIAGNOSIS — F0630 Mood disorder due to known physiological condition, unspecified: Secondary | ICD-10-CM

## 2011-07-19 DIAGNOSIS — F329 Major depressive disorder, single episode, unspecified: Secondary | ICD-10-CM

## 2011-07-19 DIAGNOSIS — F3289 Other specified depressive episodes: Secondary | ICD-10-CM

## 2011-07-20 ENCOUNTER — Encounter (HOSPITAL_BASED_OUTPATIENT_CLINIC_OR_DEPARTMENT_OTHER): Payer: Medicare Other | Admitting: Oncology

## 2011-07-20 DIAGNOSIS — M069 Rheumatoid arthritis, unspecified: Secondary | ICD-10-CM

## 2011-07-20 DIAGNOSIS — Z853 Personal history of malignant neoplasm of breast: Secondary | ICD-10-CM

## 2011-07-24 ENCOUNTER — Other Ambulatory Visit: Payer: Self-pay | Admitting: Oncology

## 2011-07-24 DIAGNOSIS — Z1231 Encounter for screening mammogram for malignant neoplasm of breast: Secondary | ICD-10-CM

## 2011-07-25 ENCOUNTER — Other Ambulatory Visit: Payer: Self-pay | Admitting: Oncology

## 2011-07-25 DIAGNOSIS — C50919 Malignant neoplasm of unspecified site of unspecified female breast: Secondary | ICD-10-CM

## 2011-08-02 ENCOUNTER — Ambulatory Visit: Payer: Medicare Other | Admitting: Psychiatry

## 2011-08-03 ENCOUNTER — Encounter (HOSPITAL_COMMUNITY): Payer: Medicare Other

## 2011-08-03 ENCOUNTER — Ambulatory Visit (HOSPITAL_COMMUNITY): Payer: Medicare Other

## 2011-08-14 ENCOUNTER — Ambulatory Visit (HOSPITAL_COMMUNITY): Payer: Medicare Other

## 2011-08-14 ENCOUNTER — Encounter (HOSPITAL_COMMUNITY): Payer: Medicare Other

## 2011-08-16 ENCOUNTER — Ambulatory Visit (INDEPENDENT_AMBULATORY_CARE_PROVIDER_SITE_OTHER): Payer: Medicare Other | Admitting: Psychiatry

## 2011-08-16 DIAGNOSIS — F0630 Mood disorder due to known physiological condition, unspecified: Secondary | ICD-10-CM

## 2011-08-16 DIAGNOSIS — F329 Major depressive disorder, single episode, unspecified: Secondary | ICD-10-CM

## 2011-08-16 DIAGNOSIS — F3289 Other specified depressive episodes: Secondary | ICD-10-CM

## 2011-08-28 ENCOUNTER — Encounter (HOSPITAL_COMMUNITY)
Admission: RE | Admit: 2011-08-28 | Discharge: 2011-08-28 | Disposition: A | Discharge status: Home / Self Care | Payer: Medicare Other | Source: Ambulatory Visit | Attending: Oncology | Admitting: Oncology

## 2011-08-28 DIAGNOSIS — C50919 Malignant neoplasm of unspecified site of unspecified female breast: Secondary | ICD-10-CM

## 2011-08-28 DIAGNOSIS — R071 Chest pain on breathing: Secondary | ICD-10-CM | POA: Insufficient documentation

## 2011-08-28 MED ORDER — TECHNETIUM TC 99M MEDRONATE IV KIT
25.0000 | PACK | Freq: Once | INTRAVENOUS | Status: AC | PRN
  Administered 2011-08-28: 24 via INTRAVENOUS

## 2011-08-30 ENCOUNTER — Ambulatory Visit: Payer: Medicare Other | Admitting: Psychiatry

## 2011-09-13 ENCOUNTER — Ambulatory Visit: Payer: Medicare Other | Admitting: Psychiatry

## 2011-09-14 LAB — POCT HEMOGLOBIN-HEMACUE: Hemoglobin: 13.1

## 2011-09-27 ENCOUNTER — Ambulatory Visit (INDEPENDENT_AMBULATORY_CARE_PROVIDER_SITE_OTHER): Payer: Medicare Other | Admitting: Psychiatry

## 2011-09-27 DIAGNOSIS — F0630 Mood disorder due to known physiological condition, unspecified: Secondary | ICD-10-CM

## 2011-09-27 DIAGNOSIS — F329 Major depressive disorder, single episode, unspecified: Secondary | ICD-10-CM

## 2011-09-27 DIAGNOSIS — F3289 Other specified depressive episodes: Secondary | ICD-10-CM

## 2011-10-11 ENCOUNTER — Ambulatory Visit: Payer: Medicare Other | Admitting: Psychiatry

## 2011-10-22 ENCOUNTER — Ambulatory Visit: Payer: Medicare Other | Admitting: Radiation Oncology

## 2011-10-23 ENCOUNTER — Ambulatory Visit
Admission: RE | Admit: 2011-10-23 | Discharge: 2011-10-23 | Disposition: A | Discharge status: Home / Self Care | Payer: Medicare Other | Source: Ambulatory Visit | Attending: Radiation Oncology | Admitting: Radiation Oncology

## 2011-10-25 ENCOUNTER — Ambulatory Visit: Payer: Medicare Other | Admitting: Psychiatry

## 2011-11-08 ENCOUNTER — Ambulatory Visit: Payer: Medicare Other | Admitting: Psychiatry

## 2011-11-29 ENCOUNTER — Telehealth: Payer: Self-pay | Admitting: *Deleted

## 2011-11-29 NOTE — Telephone Encounter (Signed)
spoke with patinet's mother she will have the patient give me a call so we can set up her 2013 appointments with dr.granfortuna

## 2012-01-07 ENCOUNTER — Ambulatory Visit
Admission: RE | Admit: 2012-01-07 | Discharge: 2012-01-07 | Disposition: A | Discharge status: Home / Self Care | Payer: Medicare Other | Source: Ambulatory Visit | Attending: Oncology | Admitting: Oncology

## 2012-01-07 ENCOUNTER — Ambulatory Visit: Payer: Medicare Other

## 2012-01-07 DIAGNOSIS — Z1231 Encounter for screening mammogram for malignant neoplasm of breast: Secondary | ICD-10-CM

## 2012-01-10 ENCOUNTER — Ambulatory Visit (HOSPITAL_BASED_OUTPATIENT_CLINIC_OR_DEPARTMENT_OTHER): Payer: Medicare Other

## 2012-01-10 DIAGNOSIS — M069 Rheumatoid arthritis, unspecified: Secondary | ICD-10-CM

## 2012-01-10 DIAGNOSIS — C50919 Malignant neoplasm of unspecified site of unspecified female breast: Secondary | ICD-10-CM

## 2012-01-10 LAB — CBC WITH DIFFERENTIAL/PLATELET
EOS%: 1.1 % (ref 0.0–7.0)
MCH: 32.6 pg (ref 25.1–34.0)
MCV: 96.5 fL (ref 79.5–101.0)
MONO%: 6.8 % (ref 0.0–14.0)
NEUT#: 3.1 10*3/uL (ref 1.5–6.5)
RBC: 4.2 10*6/uL (ref 3.70–5.45)
RDW: 13.2 % (ref 11.2–14.5)
lymph#: 1.8 10*3/uL (ref 0.9–3.3)

## 2012-01-10 LAB — COMPREHENSIVE METABOLIC PANEL
ALT: 14 U/L (ref 0–35)
AST: 18 U/L (ref 0–37)
Albumin: 4.2 g/dL (ref 3.5–5.2)
Alkaline Phosphatase: 84 U/L (ref 39–117)
Potassium: 4 mEq/L (ref 3.5–5.3)
Sodium: 138 mEq/L (ref 135–145)
Total Protein: 7.7 g/dL (ref 6.0–8.3)

## 2012-01-11 ENCOUNTER — Other Ambulatory Visit: Payer: Medicare Other | Admitting: Lab

## 2012-01-18 ENCOUNTER — Encounter: Payer: Self-pay | Admitting: Oncology

## 2012-01-18 ENCOUNTER — Ambulatory Visit (HOSPITAL_BASED_OUTPATIENT_CLINIC_OR_DEPARTMENT_OTHER): Payer: Medicare Other | Admitting: Oncology

## 2012-01-18 VITALS — BP 114/74 | HR 74 | Temp 98.0°F | Wt 143.2 lb

## 2012-01-18 DIAGNOSIS — E039 Hypothyroidism, unspecified: Secondary | ICD-10-CM | POA: Insufficient documentation

## 2012-01-18 DIAGNOSIS — F411 Generalized anxiety disorder: Secondary | ICD-10-CM

## 2012-01-18 DIAGNOSIS — F418 Other specified anxiety disorders: Secondary | ICD-10-CM

## 2012-01-18 DIAGNOSIS — C50919 Malignant neoplasm of unspecified site of unspecified female breast: Secondary | ICD-10-CM

## 2012-01-18 DIAGNOSIS — M069 Rheumatoid arthritis, unspecified: Secondary | ICD-10-CM

## 2012-01-18 DIAGNOSIS — C50912 Malignant neoplasm of unspecified site of left female breast: Secondary | ICD-10-CM | POA: Insufficient documentation

## 2012-01-18 HISTORY — DX: Rheumatoid arthritis, unspecified: M06.9

## 2012-01-18 HISTORY — DX: Other specified anxiety disorders: F41.8

## 2012-01-18 HISTORY — DX: Hypothyroidism, unspecified: E03.9

## 2012-01-18 HISTORY — DX: Malignant neoplasm of unspecified site of unspecified female breast: C50.919

## 2012-01-20 NOTE — Progress Notes (Signed)
Hematology and Oncology Follow Up Visit  Carolyn Garcia [id number] 1966/05/10 46 y.o. 01/20/2012 1:20 PM   Principle Diagnosis: Encounter Diagnoses  Name Primary?  . Breast cancer, stage 2 Yes  . Arthritis, rheumatoid   . Hypothyroid   . 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 2.5 cm primary. She was treated with lumpectomy, chemotherapy with 6 cycles of Cytoxan Adriamycin and Taxotere, then breast radiation. She has developed a chronic pain syndrome with pain in the left breast and axilla. There have been no obvious abnormal findings on multiple mammograms, ultrasounds, or breast MRI scans. She remains extremely anxious and uncomfortable. Situation complicated by underlying rheumatoid arthritis. She is doing weekly self injections of an anti-TNF antibody which is not recorded in her medication list and whose name I forgot. This is being given under supervision by Dr. Alben Deeds. She is having more panic attacks now associated with hyperventilation. She is having more musculoskeletal complains of particularly in her neck. Although diazepam works better for the muscle pain, Xanax works better for her anxiety attacks. I suggested that she try to take a Xanax when she feels a panic attack coming on and not to wait until she starts hyperventilating.   Medications: reviewed  Allergies:  Allergies  Allergen Reactions  . Aspirin   . Entex     Review of Systems: Constitutional:   No anorexia Respiratory: No cough or dyspnea Cardiovascular: No ischemic type chest pain or palpitations   Gastrointestinal: No abdominal pain or change in bowel habit Genito-Urinary: No urinary tract symptoms no vaginal bleeding Musculoskeletal: Chronic rheumatoid arthritis pain affecting both large and small  joints; now with diffuse muscle pain primarily in the neck and shoulders Neurologic: Intermittent headaches nothing progressive or severe Skin: No rash or ecchymosis Remaining ROS negative.  Physical Exam: Blood pressure 114/74, pulse 74, temperature 98 F (36.7 C), temperature source Oral, weight 143 lb 3.2 oz (64.955 kg). Wt Readings from Last 3 Encounters:  01/18/12 143 lb 3.2 oz (64.955 kg)  10/07/08 128 lb 2.1 oz (58.12 kg)  09/13/08 129 lb (58.514 kg)     General appearance: Well-nourished Caucasian woman HENNT: Pharynx no erythema or exudate Lymph nodes: No cervical supraclavicular or axillary adenopathy Breasts: Surgical changes left breast no dominant mass in either breast Lungs: Clear to auscultation resonant to percussion Heart: Regular cardiac rhythm no Abdomen: Soft nontender no mass no organomegaly Extremities: No edema no calf tenderness; no joint swelling or deformity Vascular: No cyanosis Neurologic: Mental status intact cranial nerves intact motor strength 5 over 5 reflexes 1+ symmetric Skin: No rash or ecchymosis  Lab Results: Lab Results  Component Value Date   WBC 5.4 01/10/2012   HGB 13.7 01/10/2012   HCT 40.5 01/10/2012   MCV 96.5 01/10/2012   PLT 203 01/10/2012     Chemistry      Component Value Date/Time   NA 138 01/10/2012 1559   K 4.0 01/10/2012 1559   CL 100 01/10/2012 1559   CO2 29 01/10/2012 1559   BUN 9 01/10/2012 1559   CREATININE 1.13* 01/10/2012 1559      Component Value Date/Time   CALCIUM 10.1 01/10/2012 1559   ALKPHOS 84 01/10/2012 1559   AST 18 01/10/2012 1559   ALT 14 01/10/2012 1559   BILITOT  0.5 01/10/2012 1559       Radiological Studies: Mammograms done 01/07/2012 show no areas of concern   Impression and Plan: #1. 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 Plan continued clinical and  radiographic followup on an annual basis.  #2. Rheumatoid arthritis.  #3. Hypothyroid on replacement.  #4. Chronic anxiety and depression.  #5. Chronic atypical left breast pain She is agreeable to me trying one more time to get her into a local pain clinic to assist in management.   CC:. Dr. Juline Patch: Alben Deeds: Cyndia Bent; Antony Blackbird; Adair Laundry, MD 1/27/20131:20 PM

## 2012-02-12 ENCOUNTER — Other Ambulatory Visit: Payer: Self-pay | Admitting: Radiation Oncology

## 2012-02-12 MED ORDER — HYDROCODONE-ACETAMINOPHEN 5-500 MG PO TABS: 1.0000 | ORAL_TABLET | Freq: Four times a day (QID) | ORAL | Status: DC | PRN

## 2012-03-04 ENCOUNTER — Other Ambulatory Visit: Payer: Self-pay | Admitting: Oncology

## 2012-03-04 DIAGNOSIS — Z853 Personal history of malignant neoplasm of breast: Secondary | ICD-10-CM

## 2012-03-04 DIAGNOSIS — F341 Dysthymic disorder: Secondary | ICD-10-CM

## 2012-03-11 ENCOUNTER — Other Ambulatory Visit: Payer: Medicare Other | Admitting: Lab

## 2012-05-06 ENCOUNTER — Other Ambulatory Visit: Payer: Self-pay | Admitting: Oncology

## 2012-05-09 ENCOUNTER — Other Ambulatory Visit: Payer: Self-pay | Admitting: Internal Medicine

## 2012-05-15 ENCOUNTER — Other Ambulatory Visit: Payer: Self-pay | Admitting: Radiation Oncology

## 2012-05-15 MED ORDER — HYDROCODONE-ACETAMINOPHEN 5-500 MG PO TABS: 1.0000 | ORAL_TABLET | Freq: Four times a day (QID) | ORAL | Status: DC | PRN

## 2012-05-16 ENCOUNTER — Ambulatory Visit
Admission: RE | Admit: 2012-05-16 | Discharge: 2012-05-16 | Disposition: A | Discharge status: Home / Self Care | Payer: Medicare Other | Source: Ambulatory Visit | Attending: Internal Medicine | Admitting: Internal Medicine

## 2012-06-11 ENCOUNTER — Other Ambulatory Visit: Payer: Self-pay | Admitting: Oncology

## 2012-07-11 ENCOUNTER — Other Ambulatory Visit: Payer: Self-pay | Admitting: *Deleted

## 2012-07-11 ENCOUNTER — Other Ambulatory Visit (HOSPITAL_BASED_OUTPATIENT_CLINIC_OR_DEPARTMENT_OTHER): Payer: Medicare Other | Admitting: Lab

## 2012-07-11 DIAGNOSIS — M069 Rheumatoid arthritis, unspecified: Secondary | ICD-10-CM

## 2012-07-11 DIAGNOSIS — C50919 Malignant neoplasm of unspecified site of unspecified female breast: Secondary | ICD-10-CM

## 2012-07-11 DIAGNOSIS — Z853 Personal history of malignant neoplasm of breast: Secondary | ICD-10-CM

## 2012-07-11 DIAGNOSIS — E876 Hypokalemia: Secondary | ICD-10-CM

## 2012-07-11 LAB — CBC WITH DIFFERENTIAL/PLATELET
BASO%: 0.2 % (ref 0.0–2.0)
EOS%: 0.7 % (ref 0.0–7.0)
HCT: 38.8 % (ref 34.8–46.6)
LYMPH%: 41.3 % (ref 14.0–49.7)
MCH: 32 pg (ref 25.1–34.0)
MCHC: 34.3 g/dL (ref 31.5–36.0)
MCV: 93.3 fL (ref 79.5–101.0)
MONO#: 0.3 10*3/uL (ref 0.1–0.9)
MONO%: 7.6 % (ref 0.0–14.0)
NEUT%: 50.2 % (ref 38.4–76.8)
Platelets: 203 10*3/uL (ref 145–400)

## 2012-07-11 LAB — COMPREHENSIVE METABOLIC PANEL
AST: 17 U/L (ref 0–37)
BUN: 9 mg/dL (ref 6–23)
Calcium: 9.5 mg/dL (ref 8.4–10.5)
Chloride: 98 mEq/L (ref 96–112)
Creatinine, Ser: 0.71 mg/dL (ref 0.50–1.10)

## 2012-07-11 MED ORDER — POTASSIUM CHLORIDE CRYS ER 20 MEQ PO TBCR: 20.0000 meq | EXTENDED_RELEASE_TABLET | Freq: Two times a day (BID) | ORAL | Status: DC

## 2012-07-11 NOTE — Progress Notes (Signed)
Pt mother notified that K+ low & KCL called to pharmacy.  Will recheck B-met at MD visit in 1 wk.

## 2012-07-14 ENCOUNTER — Other Ambulatory Visit: Payer: Self-pay | Admitting: *Deleted

## 2012-07-14 ENCOUNTER — Telehealth: Payer: Self-pay | Admitting: *Deleted

## 2012-07-14 DIAGNOSIS — E876 Hypokalemia: Secondary | ICD-10-CM

## 2012-07-14 NOTE — Progress Notes (Signed)
Called patient. She is aware of need to take K+ called in on Friday.  She knows to come at 3pm Friday to have lab before she sees Dr. Cyndie Chime

## 2012-07-14 NOTE — Telephone Encounter (Signed)
per orders from 07-14-2012 patient is aware of the 3:00pm lab

## 2012-07-18 ENCOUNTER — Other Ambulatory Visit: Payer: Medicare Other | Admitting: Lab

## 2012-07-18 ENCOUNTER — Ambulatory Visit (HOSPITAL_BASED_OUTPATIENT_CLINIC_OR_DEPARTMENT_OTHER): Payer: Medicare Other | Admitting: Oncology

## 2012-07-18 ENCOUNTER — Telehealth: Payer: Self-pay | Admitting: Oncology

## 2012-07-18 VITALS — BP 106/72 | HR 58 | Temp 97.0°F | Ht 66.0 in | Wt 144.4 lb

## 2012-07-18 DIAGNOSIS — M069 Rheumatoid arthritis, unspecified: Secondary | ICD-10-CM

## 2012-07-18 DIAGNOSIS — Z853 Personal history of malignant neoplasm of breast: Secondary | ICD-10-CM

## 2012-07-18 DIAGNOSIS — F341 Dysthymic disorder: Secondary | ICD-10-CM

## 2012-07-18 DIAGNOSIS — N644 Mastodynia: Secondary | ICD-10-CM

## 2012-07-18 DIAGNOSIS — E876 Hypokalemia: Secondary | ICD-10-CM

## 2012-07-18 DIAGNOSIS — C50919 Malignant neoplasm of unspecified site of unspecified female breast: Secondary | ICD-10-CM

## 2012-07-18 LAB — BASIC METABOLIC PANEL
CO2: 29 mEq/L (ref 19–32)
Chloride: 101 mEq/L (ref 96–112)
Creatinine, Ser: 0.88 mg/dL (ref 0.50–1.10)

## 2012-07-18 NOTE — Progress Notes (Signed)
CC:   Juline Patch, M.D. Alben Deeds, MD Currie Paris, M.D. Billie Lade, Ph.D., M.D. Malva Limes, M.D.  FOLLOWUP VISIT  A followup visit for this pleasant 46 year old woman with juvenile rheumatoid arthritis, who was subsequently diagnosed with stage II, 2- node-positive, triple-negative cancer of the left breast in December, 2003 at age 71.  A 2.5 cm primary.  She was treated with lumpectomy; 6 cycles of Cytoxan, Adriamycin and Taxotere chemotherapy; then breast radiation.  She has remained free of any obvious new disease, now out almost 10 years.  Unfortunately, she has developed an atypical chronic pain syndrome with pain in the left breast and axilla.  Extensive evaluations with repetitive mammograms, ultrasounds, and MRI scans have failed to reveal any additional pathology.  She chronically depressed due to the chronic pain syndrome.  We still have not been able to get her into a pain clinic.  She continues on weekly injections of Simponi (golimumab), a TNF inhibitor, under the supervision of Dr. Alben Deeds.  Most of her arthritis complaints are in her knees, feet and hands.  She did start to get counseling from Dr. Noe Gens in our office, but there were some problems with the reimbursement and she had to stop the sessions.  She did feel that these were helping.  Dr. Noe Gens has a family member with rheumatoid arthritis and was able empathize with the patient.  She has had no interim medical problems.  Routine lab done in anticipation of today's visit last week unexpectedly showed a low potassium of 2.9.  She is not taking any steroids, she is not on a diuretic, she has not had any diarrhea recently.  She was started on oral potassium replacement.  Repeat level today is 3.4.  She has no new bone pain other than that related to her rheumatoid arthritis.  REVIEW OF SYSTEMS:  Otherwise unremarkable.  PHYSICAL EXAMINATION:  Head and neck:  Normal.  Pharynx:   No erythema or exudate.  Lungs:  Clear and resonant to percussion.  Regular cardiac rhythm.  No murmur.  Surgical changes in the left breast.  No dominant mass in either breast.  She remains tender on palpation of the left breast and the left axilla.  Abdomen:  Soft and nontender.  No mass, no organomegaly.  Extremities:  No edema.  No calf tenderness.  No joint deformities.  Neurologic:  Mental status intact.  Cranial nerves intact. Motor strength 5/5.  Reflexes 1+, symmetric.  Most recent mammograms done January 07, 2012, show no new disease.  IMPRESSION: 1. Stage II, 2-node-positive, triple-negative cancer of the left     breast, treated as outlined above.  She remains free of any obvious     recurrence, now out almost 10 years from diagnosis in December     2003. 2. Rheumatoid arthritis. 3. 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 4. Chronic anxiety and depression related to number 3. 5. Hypothyroid, on replacement.    ______________________________ Levert Feinstein, M.D., F.A.C.P. JMG/MEDQ  D:  07/18/2012  T:  07/18/2012  Job:  [id number]

## 2012-07-18 NOTE — Telephone Encounter (Signed)
Gave pt appt for October 2013 lab only and see MD in January 2014 with labs

## 2012-07-24 ENCOUNTER — Other Ambulatory Visit: Payer: Self-pay

## 2012-07-24 DIAGNOSIS — F419 Anxiety disorder, unspecified: Secondary | ICD-10-CM

## 2012-07-24 MED ORDER — ALPRAZOLAM 0.25 MG PO TABS: 0.2500 mg | ORAL_TABLET | Freq: Four times a day (QID) | ORAL | Status: DC | PRN

## 2012-08-19 ENCOUNTER — Other Ambulatory Visit (HOSPITAL_BASED_OUTPATIENT_CLINIC_OR_DEPARTMENT_OTHER): Payer: Medicare Other | Admitting: Lab

## 2012-08-19 DIAGNOSIS — E876 Hypokalemia: Secondary | ICD-10-CM

## 2012-08-19 LAB — BASIC METABOLIC PANEL (CC13)
BUN: 9 mg/dL (ref 7.0–26.0)
CO2: 27 mEq/L (ref 22–29)
Chloride: 105 mEq/L (ref 98–107)
Creatinine: 0.8 mg/dL (ref 0.6–1.1)

## 2012-08-26 ENCOUNTER — Telehealth: Payer: Self-pay | Admitting: *Deleted

## 2012-08-26 NOTE — Telephone Encounter (Signed)
Called patient and left message on cell phone.  Her K+ is now normal, but she may want to continue just the daily.  Asked that she call if she has any questions.

## 2012-09-15 ENCOUNTER — Inpatient Hospital Stay: Admission: RE | Admit: 2012-09-15 | Payer: Medicare Other | Source: Ambulatory Visit

## 2012-09-15 ENCOUNTER — Other Ambulatory Visit: Payer: Self-pay | Admitting: Oncology

## 2012-09-15 DIAGNOSIS — Z1231 Encounter for screening mammogram for malignant neoplasm of breast: Secondary | ICD-10-CM

## 2012-09-15 DIAGNOSIS — Z853 Personal history of malignant neoplasm of breast: Secondary | ICD-10-CM

## 2013-01-07 ENCOUNTER — Ambulatory Visit
Admission: RE | Admit: 2013-01-07 | Discharge: 2013-01-07 | Disposition: A | Discharge status: Home / Self Care | Payer: Medicare Other | Source: Ambulatory Visit | Attending: Oncology | Admitting: Oncology

## 2013-01-07 DIAGNOSIS — Z1231 Encounter for screening mammogram for malignant neoplasm of breast: Secondary | ICD-10-CM

## 2013-01-07 DIAGNOSIS — Z853 Personal history of malignant neoplasm of breast: Secondary | ICD-10-CM

## 2013-01-09 ENCOUNTER — Telehealth: Payer: Self-pay | Admitting: Radiation Oncology

## 2013-01-09 NOTE — Telephone Encounter (Signed)
Promptly returned patient's call. Patient requesting refill on vicodin prescription. Noted patient had not been seen in the radiation oncology clinic by Dr. Roselind Messier since 10/23/2011. A follow up appointment for this patient was arranged by Talbert Forest on 02/02/13 at 1600. Patient reports bilateral breast pain. Patient reports that she has enough medication to last her through the weekend. Patient completed treatment over eight years ago. Explained to the patient that when Dr. Roselind Messier returned on Monday this writer would speak with him reference this matter. Patient stressed that Dr. Roselind Messier is aware of her situation and told her to call anytime. Will call patient back on Monday with Dr. Trina Ao response.

## 2013-01-13 ENCOUNTER — Telehealth: Payer: Self-pay | Admitting: Radiation Oncology

## 2013-01-13 MED ORDER — HYDROCODONE-ACETAMINOPHEN 5-500 MG PO TABS: 1.0000 | ORAL_TABLET | Freq: Four times a day (QID) | ORAL | Status: DC | PRN

## 2013-01-14 NOTE — Telephone Encounter (Signed)
Faxed Vicodin script to [facility] on [address] at [phone]. Confirmation fax obtained. Phoned patient at home informing her this had been done. Reminded patient of upcoming follow up appointment with Dr. Roselind Messier. Patient verbalized understanding and expressed appreciation for the call.

## 2013-01-15 ENCOUNTER — Telehealth: Payer: Self-pay | Admitting: *Deleted

## 2013-01-15 NOTE — Telephone Encounter (Signed)
Patient called and moved her appt from tomorrow to Monday. The patient is sick.  JMW

## 2013-01-16 ENCOUNTER — Other Ambulatory Visit: Payer: Medicare Other | Admitting: Lab

## 2013-01-19 ENCOUNTER — Other Ambulatory Visit (HOSPITAL_BASED_OUTPATIENT_CLINIC_OR_DEPARTMENT_OTHER): Payer: Medicare Other

## 2013-01-19 DIAGNOSIS — C50919 Malignant neoplasm of unspecified site of unspecified female breast: Secondary | ICD-10-CM

## 2013-01-19 DIAGNOSIS — E876 Hypokalemia: Secondary | ICD-10-CM

## 2013-01-19 LAB — COMPREHENSIVE METABOLIC PANEL (CC13)
AST: 16 U/L (ref 5–34)
Albumin: 3.8 g/dL (ref 3.5–5.0)
Alkaline Phosphatase: 69 U/L (ref 40–150)
Potassium: 3.9 mEq/L (ref 3.5–5.1)
Sodium: 141 mEq/L (ref 136–145)
Total Protein: 7.2 g/dL (ref 6.4–8.3)

## 2013-01-19 LAB — CBC WITH DIFFERENTIAL/PLATELET
EOS%: 0.7 % (ref 0.0–7.0)
MCH: 31.1 pg (ref 25.1–34.0)
MCHC: 34 g/dL (ref 31.5–36.0)
MCV: 91.6 fL (ref 79.5–101.0)
MONO%: 6.8 % (ref 0.0–14.0)
NEUT#: 3.1 10*3/uL (ref 1.5–6.5)
RBC: 4.08 10*6/uL (ref 3.70–5.45)
RDW: 12.4 % (ref 11.2–14.5)

## 2013-01-23 ENCOUNTER — Ambulatory Visit (HOSPITAL_BASED_OUTPATIENT_CLINIC_OR_DEPARTMENT_OTHER): Payer: Medicare Other | Admitting: Oncology

## 2013-01-23 VITALS — BP 132/77 | HR 83 | Temp 98.0°F | Resp 20 | Ht 67.0 in | Wt 141.6 lb

## 2013-01-23 DIAGNOSIS — M069 Rheumatoid arthritis, unspecified: Secondary | ICD-10-CM

## 2013-01-23 DIAGNOSIS — C50919 Malignant neoplasm of unspecified site of unspecified female breast: Secondary | ICD-10-CM

## 2013-01-23 DIAGNOSIS — F411 Generalized anxiety disorder: Secondary | ICD-10-CM

## 2013-01-23 DIAGNOSIS — Z853 Personal history of malignant neoplasm of breast: Secondary | ICD-10-CM

## 2013-01-23 DIAGNOSIS — G894 Chronic pain syndrome: Secondary | ICD-10-CM

## 2013-01-26 NOTE — Progress Notes (Signed)
Hematology and Oncology Follow Up Visit  Carolyn Garcia [id number] 26-Aug-1966 47 y.o. 01/26/2013 10:18 AM   Principle Diagnosis: Encounter Diagnoses  Name Primary?  . ADENOCARCINOMA, BREAST, HX OF   . Breast cancer, stage 2 Yes     Interim History:   Follow up visit for this pleasant 47 year old woman with long-standing rheumatoid arthritis who was diagnosed with a stage 2, 2.5 cm primary, 2-node positive, triple negative, poorly differentiated, grade 3,cancer of the left breast, at age 57 in November 2003.  She was treated with lumpectomy on 11/13/2002,  then chemotherapy with six cycles of Cytoxan/Adriamycin and Taxotere followed by breast radiation.. She has had a chronic pains yndrome involving the left breast and left axilla. There  are no obvious abnormal findings on multiple mammograms or MRIs of the breast.   Overall doing well. She continues to have pain in the left breast and left axilla. Most recent mammograms done on 01/07/2013 showed no obvious new disease.  Her rheumatoid arthritis is under reasonable control.she uses when necessary nonsteroidal anti-inflammatories. She is still getting monthly injections of a TNF inhibitor- Golimumab (Simponi) which she does feel helps and is much better tolerated than Enbrel or methotrexate which she has taken in the recent past. She has had no other interim medical problems.  She denies any persistent headaches, bone pain, or vaginal bleeding.  Medications: reviewed  Allergies:  Allergies  Allergen Reactions  . Aspirin   . Entex     Review of Systems: Constitutional:   Chronic fatigue related to her are a Respiratory:no cough or dyspnea Cardiovascular:  She has had some intermittent palpitations and will be seeing a cardiologist Gastrointestinal:no change in bowel habit Genito-Urinary: no urinary tract symptoms Musculoskeletal:chronic arthritis pain due to RA Neurologic:no headache or change in vision Skin:no rash or  ecchymosis Remaining ROS negative.  Physical Exam: Blood pressure 132/77, pulse 83, temperature 98 F (36.7 C), temperature source Oral, resp. rate 20, height 5\' 7"  (1.702 m), weight 141 lb 9.6 oz (64.229 kg). Wt Readings from Last 3 Encounters:  01/23/13 141 lb 9.6 oz (64.229 kg)  07/18/12 144 lb 6.4 oz (65.499 kg)  01/18/12 143 lb 3.2 oz (64.955 kg)     General appearance: well-nourished Caucasian woman HENNT: pharynx no erythema or exudate Lymph nodes: no cervical, supraclavicular, or axillary adenopathychronic tenderness on palpation in the left axilla Breasts:surgical changes left breast. No dominant mass in either breast Lungs:clear to auscultation resonant to percussion Heart:regular rhythm no murmur Abdomen:soft, nontender, no mass, no organomegaly Extremities:no edema, no calf tenderness Vascular:no cyanosis Neurologic:motor strength 5 over 5, reflexes 1+ symmetric Skin:  Lab Results: Lab Results  Component Value Date   WBC 5.2 01/19/2013   HGB 12.7 01/19/2013   HCT 37.4 01/19/2013   MCV 91.6 01/19/2013   PLT 196 01/19/2013     Chemistry      Component Value Date/Time   NA 141 01/19/2013 1604   NA 138 07/18/2012 1523   K 3.9 01/19/2013 1604   K 3.4* 07/18/2012 1523   CL 104 01/19/2013 1604   CL 101 07/18/2012 1523   CO2 29 01/19/2013 1604   CO2 29 07/18/2012 1523   BUN 6.4* 01/19/2013 1604   BUN 9 07/18/2012 1523   CREATININE 0.8 01/19/2013 1604   CREATININE 0.88 07/18/2012 1523      Component Value Date/Time   CALCIUM 9.4 01/19/2013 1604   CALCIUM 9.6 07/18/2012 1523   ALKPHOS 69 01/19/2013 1604   ALKPHOS 70 07/11/2012 1544  AST 16 01/19/2013 1604   AST 17 07/11/2012 1544   ALT 14 01/19/2013 1604   ALT 12 07/11/2012 1544   BILITOT 0.31 01/19/2013 1604   BILITOT 0.4 07/11/2012 1544       Radiological Studies: Mm Digital Screening  01/08/2013  *RADIOLOGY REPORT*  Clinical Data: Screening. History of left lumpectomy for breast cancer in 2004.  DIGITAL BILATERAL  SCREENING MAMMOGRAM WITH CAD DIGITAL BREAST TOMOSYNTHESIS  Digital breast tomosynthesis images are acquired in two projections.  These images are reviewed in combination with the digital mammogram, confirming the findings below.  Comparison:  Previous exams.  FINDINGS:  ACR Breast Density Category 3: The breast tissue is heterogeneously dense.  No suspicious masses, architectural distortion, or calcifications are present.  Left lumpectomy changes are again noted.  Images were processed with CAD.  IMPRESSION: No mammographic evidence of malignancy.  A result letter of this screening mammogram will be mailed directly to the patient.  RECOMMENDATION: Screening mammogram in one year. (Code:SM-B-01Y)  BI-RADS CATEGORY 1:  Negative.   Original Report Authenticated By: Christiana Pellant, M.D.     Impression and Plan: #1. Stage II triple negative breast cancer treated as outlined above She remains free of any obvious new disease now out a remarkable 10 years from diagnosis in November 2003. Will decrease visits to once a year at this time. I will go ahead and schedule a two-year interval MRI of the breast.  #2. Rheumatoid arthritis  #3. 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 Chronic anxiety and depression related to #3.  #5. History of Hashimoto's thyroiditis status post right hemithyroidectomy now hypothyroid on replacement.   CC:. Dr. Caryn Section; Dr. Antony Blackbird; Dr. Cicero Duck; Dr. Malva Limes; Dr. Laurence Aly   Levert Feinstein, MD 2/3/201410:18 AM

## 2013-01-28 ENCOUNTER — Telehealth: Payer: Self-pay | Admitting: Oncology

## 2013-01-28 NOTE — Telephone Encounter (Signed)
[facility]ed [facility] and left message with Lynden Ang , scheduler for Breast MRI left message , called pt informed her to expect a call from [facility]

## 2013-02-02 ENCOUNTER — Ambulatory Visit
Admission: RE | Admit: 2013-02-02 | Discharge: 2013-02-02 | Disposition: A | Discharge status: Home / Self Care | Payer: Medicare Other | Source: Ambulatory Visit | Attending: Radiation Oncology | Admitting: Radiation Oncology

## 2013-02-02 ENCOUNTER — Telehealth: Payer: Self-pay | Admitting: *Deleted

## 2013-02-02 ENCOUNTER — Encounter: Payer: Self-pay | Admitting: Radiation Oncology

## 2013-02-02 VITALS — BP 114/69 | HR 83 | Temp 98.0°F | Resp 18 | Wt 142.9 lb

## 2013-02-02 DIAGNOSIS — C50919 Malignant neoplasm of unspecified site of unspecified female breast: Secondary | ICD-10-CM

## 2013-02-02 NOTE — Progress Notes (Signed)
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  She continues to have pain in the left breast and axillary region. She takes approximately 2 Vicodin per day concerning this issue. She did see Dr. Cyndie Chime  last month and received a good report. Patient also underwent digital mammographic screening on January 15 which showed no suspicious area in either breast. Patient will also be undergoing MRI later this month of the breast area.  Patient is having pain in her left knee and will be seeing her rheumatologist for this issue. She is also having pain in the neck area and will likely undergo imaging of this area at the discretion of the evaluating physician.  Patient also complains of pain in the upper aspect of her right breast in light of her large pendulous breasts. She has met with Dr. Kelly Splinter in the past concerning breast reduction on the right side. She would like to meet with Dr. Etter Sjogren concerning this issue and I will schedule consultation with him.  She denies any problems with swelling in her left arm or hand. She denies any nipple discharge or drainage                        ALLERGIES:  is allergic to aspirin and entex.  Meds: Current Outpatient Prescriptions  Medication Sig Dispense Refill  . ALPRAZolam (XANAX) 0.25 MG tablet Take 1 tablet (0.25 mg total) by mouth every 6 (six) hours as needed for anxiety.  50 tablet  3  . benzonatate (TESSALON) 200 MG capsule Take 200 mg by mouth 3 (three) times daily as needed.      . calcium citrate-vitamin D 200-200 MG-UNIT TABS Take 2 tablets by mouth daily.      .  cholecalciferol (VITAMIN D) 1000 UNITS tablet Take 2,000 Units by mouth daily.      . cyclobenzaprine (FLEXERIL) 10 MG tablet Take 10 mg by mouth daily as needed.       . Golimumab (SIMPONI) 50 MG/0.5ML SOLN Inject 50 mg into the skin every 30 (thirty) days.      Marland Kitchen HYDROcodone-acetaminophen (VICODIN) 5-500 MG per tablet Take 1-2 tablets by mouth every 6 (six) hours as needed for pain. Take 1 to 2 tablets every 6 hours prn pain qty 100 with 2 refills called into [facility] to [facility]  100 tablet  2  . LIDODERM 5 % Place onto the skin as needed.      . meloxicam (MOBIC) 15 MG tablet Take 15 mg by mouth daily as needed.       . Probiotic Product (ALIGN) 4 MG CAPS Take 4 mg by mouth daily as needed.       Marland Kitchen SYNTHROID 50 MCG tablet Take 75 mcg by mouth Daily.       Marland Kitchen dicyclomine (BENTYL) 20 MG tablet        No current facility-administered medications for this encounter.    Physical Findings: The patient is in no acute distress. Patient is alert and oriented.  weight is 142 lb 14.4 oz (64.819 kg). Her oral temperature is  98 F (36.7 C). Her blood pressure is 114/69 and her pulse is 83. Her respiration is 18. Marland Kitchen  No palpable cervical supraclavicular or axillary adenopathy. The lungs are clear to auscultation. The heart has regular rhythm and rate.  examination right breast reveals it to be large and somewhat pendulous without mass or nipple discharge. The left breast is slightly smaller. There is some retraction of skin around the lumpectomy site with some indentation of tissue which is been noticed on previous exams. There is mild induration at the lumpectomy site but no dominant masses appreciated breast nipple discharge or bleeding. Patient continues to be quite uncomfortable with the exam and palpation along the left breast area and axillary region.  Lab Findings: Lab Results  Component Value Date   WBC 5.2 01/19/2013   HGB 12.7 01/19/2013   HCT 37.4 01/19/2013   MCV 91.6 01/19/2013   PLT 196  01/19/2013    @LASTCHEM @  Radiographic Findings: Mm Digital Screening  01/08/2013  *RADIOLOGY REPORT*  Clinical Data: Screening. History of left lumpectomy for breast cancer in 2004.  DIGITAL BILATERAL SCREENING MAMMOGRAM WITH CAD DIGITAL BREAST TOMOSYNTHESIS  Digital breast tomosynthesis images are acquired in two projections.  These images are reviewed in combination with the digital mammogram, confirming the findings below.  Comparison:  Previous exams.  FINDINGS:  ACR Breast Density Category 3: The breast tissue is heterogeneously dense.  No suspicious masses, architectural distortion, or calcifications are present.  Left lumpectomy changes are again noted.  Images were processed with CAD.  IMPRESSION: No mammographic evidence of malignancy.  A result letter of this screening mammogram will be mailed directly to the patient.  RECOMMENDATION: Screening mammogram in one year. (Code:SM-B-01Y)  BI-RADS CATEGORY 1:  Negative.   Original Report Authenticated By: Christiana Pellant, M.D.     Impression:  No evidence of recurrence on clinical exam today. She continues to have chronic pain in the left breast and axillary region for which she takes Vicodin.  Plan:  Routine followup in 6 months.    _____________________________________  Billie Lade, PhD, MD

## 2013-02-02 NOTE — Telephone Encounter (Signed)
Received message from pt asking if "Dr. Cyndie Chime would give me a prescription for Valium because I'm going to have an MRI 02/18/13?"  Note to Dr. Cyndie Chime.

## 2013-02-02 NOTE — Progress Notes (Signed)
Patient presents to the clinic today unaccompanied for follow up with Dr. Roselind Messier. Patient alert and oriented to person, place, and time. No distress noted. Steady gait noted. Pleasant affect noted. Patient reports left breast and axilla pain and sensitivity for which she takes vicodin. Also, patient reports pulling discomfort in right breast related to porta cath. Patient denies nipple discharge in either breast. Patient denies palpating any lumps or bumps of either breast. Patient denies nausea, vomiting, or diarrhea. Patient reports frequent headache and dizziness related to neck pain. Patient reports difficulty sleeping at night due to busy mind and pain. Reported all findings to Dr. Roselind Messier.

## 2013-02-03 ENCOUNTER — Other Ambulatory Visit: Payer: Self-pay | Admitting: *Deleted

## 2013-02-03 ENCOUNTER — Telehealth: Payer: Self-pay | Admitting: *Deleted

## 2013-02-03 DIAGNOSIS — F411 Generalized anxiety disorder: Secondary | ICD-10-CM

## 2013-02-03 MED ORDER — DIAZEPAM 5 MG PO TABS: 5.0000 mg | ORAL_TABLET | Freq: Four times a day (QID) | ORAL | Status: DC | PRN

## 2013-02-03 NOTE — Telephone Encounter (Signed)
Called patient to inform of appt. With Dr. Odis Luster on 02-10-13 arrival time - 8:00 am, spoke with patient and she is aware of this appt.

## 2013-02-11 ENCOUNTER — Other Ambulatory Visit: Payer: Self-pay | Admitting: Internal Medicine

## 2013-02-11 DIAGNOSIS — M25569 Pain in unspecified knee: Secondary | ICD-10-CM

## 2013-02-18 ENCOUNTER — Other Ambulatory Visit: Payer: Medicare Other

## 2013-02-18 ENCOUNTER — Ambulatory Visit
Admission: RE | Admit: 2013-02-18 | Discharge: 2013-02-18 | Disposition: A | Discharge status: Home / Self Care | Payer: Medicare Other | Source: Ambulatory Visit | Attending: Oncology | Admitting: Oncology

## 2013-02-18 ENCOUNTER — Ambulatory Visit
Admission: RE | Admit: 2013-02-18 | Discharge: 2013-02-18 | Disposition: A | Discharge status: Home / Self Care | Payer: Medicare Other | Source: Ambulatory Visit | Attending: Internal Medicine | Admitting: Internal Medicine

## 2013-02-18 DIAGNOSIS — C50919 Malignant neoplasm of unspecified site of unspecified female breast: Secondary | ICD-10-CM

## 2013-02-18 DIAGNOSIS — M25569 Pain in unspecified knee: Secondary | ICD-10-CM

## 2013-02-18 DIAGNOSIS — Z853 Personal history of malignant neoplasm of breast: Secondary | ICD-10-CM

## 2013-02-18 MED ORDER — GADOBENATE DIMEGLUMINE 529 MG/ML IV SOLN
13.0000 mL | Freq: Once | INTRAVENOUS | Status: AC | PRN
  Administered 2013-02-18: 13 mL via INTRAVENOUS

## 2013-02-19 ENCOUNTER — Other Ambulatory Visit: Payer: Medicare Other

## 2013-02-20 ENCOUNTER — Telehealth: Payer: Self-pay | Admitting: *Deleted

## 2013-02-20 NOTE — Telephone Encounter (Signed)
Message copied by Orbie Hurst on Fri Feb 20, 2013 12:08 PM ------      Message from: Levert Feinstein      Created: Fri Feb 20, 2013  7:33 AM       Call pt  MRI breasts normal ------

## 2013-02-20 NOTE — Telephone Encounter (Signed)
Spoke with patient.  Let her know that MRI ob breasts was normal.  She appreciated the call.

## 2013-03-17 ENCOUNTER — Telehealth: Payer: Self-pay | Admitting: Radiation Oncology

## 2013-03-17 NOTE — Telephone Encounter (Signed)
Per Dr. Trina Ao order phoned in script for Vicodin 5/325 mg 1-2 tablets every six hours prn pain, quantity 100 and no refills. Script verbalized to [employer] at [facility]. Phoned patient informing her of these actions.

## 2013-03-17 NOTE — Telephone Encounter (Signed)
Patient phoned back. Patient reports that her pharmacy has done away with Vicodin 5/500 because of the Tylenol content. Patient has two pills left. Patient requesting new pain script. Patient confirmed she is only allergic to entex and aspirin. Instructed patient this writer would contact her when new script was ready for pick up. Patient verbalized understanding.

## 2013-03-17 NOTE — Telephone Encounter (Signed)
Returned patient's call. Patient left message with Darryl Nestle verbalizing medication concerns. No answer. Left message requesting return call.

## 2013-04-29 ENCOUNTER — Telehealth: Payer: Self-pay | Admitting: *Deleted

## 2013-04-29 NOTE — Telephone Encounter (Signed)
Received return call from pt requesting refill on Hydrocodone. She states she continues to have chronic breast/axilla pain, takes 2 tabs nightly, occasionally takes 1/2-1 tab during day. She states she "has constant pain". Informed pt will route her request to Dr Roselind Messier, call her w/his response. Pt '[facility] [phone]. Pt verbalized agreement, understanding.

## 2013-04-30 ENCOUNTER — Telehealth: Payer: Self-pay | Admitting: *Deleted

## 2013-04-30 NOTE — Telephone Encounter (Signed)
Left vm for pt that refill on Hydrocodone -APAP refill called in to [facility], [address]. Left call back name and number.

## 2013-05-01 NOTE — Telephone Encounter (Signed)
Refilled per Dr Roselind Messier, pt notified.

## 2013-07-27 ENCOUNTER — Other Ambulatory Visit: Payer: Self-pay | Admitting: Oncology

## 2013-07-29 ENCOUNTER — Telehealth: Payer: Self-pay | Admitting: *Deleted

## 2013-07-29 NOTE — Telephone Encounter (Signed)
Spoke w/pt's mother, Karlyn Glasco re: pain med refill request from [facility]. Informed her that Dr Roselind Messier is out of office until 08/03/13. Discussed w/Dr Michell Heinrich, and she authorized refill on med with no additional refills. Called pt's mother and informed her will call in to [facility] [address]. Mother expressed understanding and thanks.

## 2013-09-17 ENCOUNTER — Encounter: Payer: Self-pay | Admitting: Oncology

## 2013-09-21 ENCOUNTER — Encounter: Payer: Self-pay | Admitting: Radiation Oncology

## 2013-09-21 ENCOUNTER — Ambulatory Visit
Admission: RE | Admit: 2013-09-21 | Discharge: 2013-09-21 | Disposition: A | Discharge status: Home / Self Care | Payer: Medicare Other | Source: Ambulatory Visit | Attending: Radiation Oncology | Admitting: Radiation Oncology

## 2013-09-21 VITALS — BP 105/69 | HR 83 | Temp 98.4°F | Ht 67.0 in | Wt 136.9 lb

## 2013-09-21 DIAGNOSIS — C50912 Malignant neoplasm of unspecified site of left female breast: Secondary | ICD-10-CM

## 2013-09-21 MED ORDER — HYDROCODONE-ACETAMINOPHEN 5-325 MG PO TABS: 2.0000 | ORAL_TABLET | Freq: Four times a day (QID) | ORAL | Status: DC | PRN

## 2013-09-21 NOTE — Progress Notes (Signed)
Carolyn Garcia here for follow up after treatment to her left breast.  She is having pain in her left breast and underarm that she is rating at a 7/10.  She also developed pain in her right breast and underarm two weeks ago.  She say the pain in her right breast feels deep.  She is taking 2 tablets of norco per day that takes the edge off.  She states that both of her underarms feels swollen occasionally.  She does have fatigue.  She also reports a poor appetite.  She has lost 6 lbs since may.  The skin on her left breast is intact.

## 2013-09-21 NOTE — Progress Notes (Signed)
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  She continues to have chronic pain in the left breast area  adjacent to her lumpectomy site. She presents today with a new complaint of pain in the right breast area. She says this is deep within the breast in the upper portion of the right breast.  this is been going on approximately 2 weeks. She denies any nipple discharge or bleeding from the right. She denies any nipple discharge or bleeding from the left breast.                              ALLERGIES:  is allergic to aspirin and entex.  Meds: Current Outpatient Prescriptions  Medication Sig Dispense Refill  . ALPRAZolam (XANAX) 0.25 MG tablet TAKE 1 TABLET BY MOUTH EVERY 6 HOURS AS NEEDED FOR ANXIETY  50 tablet  0  . benzonatate (TESSALON) 200 MG capsule Take 200 mg by mouth 3 (three) times daily as needed.      . calcium citrate-vitamin D 200-200 MG-UNIT TABS Take 2 tablets by mouth daily.      . cholecalciferol (VITAMIN D) 1000 UNITS tablet Take 2,000 Units by mouth daily.      . cyclobenzaprine (FLEXERIL) 10 MG tablet Take 10 mg by mouth daily as needed.       . Golimumab (SIMPONI) 50 MG/0.5ML SOLN Inject 50 mg into the skin every 30 (thirty) days.      Marland Kitchen HYDROcodone-acetaminophen (NORCO/VICODIN) 5-325 MG per tablet Take 2 tablets by mouth every 6 (six) hours as needed for pain (Take 1-2 tablets every six hours prn pain; [facility] to refill Vicodin 5/500 mg).  120 tablet  5  . LIDODERM 5 % Place onto the skin as needed.      Marland Kitchen SYNTHROID 50 MCG tablet Take 75 mcg by mouth Daily.       .  diazepam (VALIUM) 5 MG tablet Take 1 tablet (5 mg total) by mouth every 6 (six) hours as needed for anxiety.  20 tablet  1  . dicyclomine (BENTYL) 20 MG tablet       . meloxicam (MOBIC) 15 MG tablet Take 15 mg by mouth daily as needed.       . nortriptyline (PAMELOR) 25 MG capsule       . Probiotic Product (ALIGN) 4 MG CAPS Take 4 mg by mouth daily as needed.        No current facility-administered medications for this encounter.    Physical Findings: The patient is in no acute distress. Patient is alert and oriented.  height is 5\' 7"  (1.702 m) and weight is 136 lb 14.4 oz (62.097 kg). Her temperature is 98.4 F (36.9 C). Her blood pressure is 105/69 and her pulse is 83. Marland Kitchen  No palpable supraclavicular or axillary adenopathy. The lungs are clear to auscultation. The heart has a regular rhythm and rate. Examination of the left breast reveals some mild indention along the area of the lumpectomy scar. There is no dominant mass appreciated in the breast nipple  discharge or bleeding. No nipple retraction is noted. Examination of the right breast reveals some mild thickening in the upper  Central deep breast area. Patient is tender with palpation in this region. There is no nipple discharge or bleeding. There is no dominant mass appreciated within the breast.  Lab Findings: Lab Results  Component Value Date   WBC 5.2 01/19/2013   HGB 12.7 01/19/2013   HCT 37.4 01/19/2013   MCV 91.6 01/19/2013   PLT 196 01/19/2013      Radiographic Findings: No results found.  Impression:  No evidence of recurrence on clinical exam today concerning her left breast cancer. As above patient has new pain in the right upper central breast area.  She will be scheduled for ultrasound and digital diagnostic right mammogram for further evaluation of this issue. 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 I have ordered MRI for further evaluation  of this issue.  Plan:  Routine followup in one year unless imaging shows a new problem. Patient will be seen in medical oncology in January.  _____________________________________  -----------------------------------  Billie Lade, PhD, MD

## 2013-09-22 ENCOUNTER — Telehealth: Payer: Self-pay | Admitting: *Deleted

## 2013-09-22 NOTE — Telephone Encounter (Signed)
Called patient to inform of tests, spoke with patient and she is aware of these appts. 

## 2013-09-24 ENCOUNTER — Ambulatory Visit
Admission: RE | Admit: 2013-09-24 | Discharge: 2013-09-24 | Disposition: A | Discharge status: Home / Self Care | Payer: Medicare Other | Source: Ambulatory Visit | Attending: Radiation Oncology | Admitting: Radiation Oncology

## 2013-09-24 ENCOUNTER — Ambulatory Visit (HOSPITAL_COMMUNITY): Payer: Medicare Other

## 2013-09-24 DIAGNOSIS — C50912 Malignant neoplasm of unspecified site of left female breast: Secondary | ICD-10-CM

## 2013-09-24 DIAGNOSIS — C50919 Malignant neoplasm of unspecified site of unspecified female breast: Secondary | ICD-10-CM | POA: Insufficient documentation

## 2013-09-29 ENCOUNTER — Ambulatory Visit (HOSPITAL_COMMUNITY)
Admission: RE | Admit: 2013-09-29 | Discharge: 2013-09-29 | Disposition: A | Discharge status: Home / Self Care | Payer: Medicare Other | Source: Ambulatory Visit | Attending: Radiation Oncology | Admitting: Radiation Oncology

## 2013-09-29 DIAGNOSIS — C50912 Malignant neoplasm of unspecified site of left female breast: Secondary | ICD-10-CM

## 2013-09-29 DIAGNOSIS — Z853 Personal history of malignant neoplasm of breast: Secondary | ICD-10-CM | POA: Insufficient documentation

## 2013-09-29 MED ORDER — GADOBENATE DIMEGLUMINE 529 MG/ML IV SOLN
12.0000 mL | Freq: Once | INTRAVENOUS | Status: AC | PRN
  Administered 2013-09-29: 12 mL via INTRAVENOUS

## 2013-09-30 ENCOUNTER — Other Ambulatory Visit: Payer: Self-pay | Admitting: *Deleted

## 2013-09-30 DIAGNOSIS — C50919 Malignant neoplasm of unspecified site of unspecified female breast: Secondary | ICD-10-CM

## 2013-09-30 MED ORDER — ALPRAZOLAM 0.25 MG PO TABS: ORAL_TABLET | ORAL | Status: DC

## 2013-09-30 NOTE — Telephone Encounter (Signed)
Dr. Cyndie Chime approves refill X 1 with future refills per her PCP.

## 2013-09-30 NOTE — Addendum Note (Signed)
Addended by: Wandalee Ferdinand on: 09/30/2013 03:29 PM   Modules accepted: Orders

## 2013-09-30 NOTE — Telephone Encounter (Signed)
Refill request for alprazolam to MD desk for approval.

## 2013-10-01 ENCOUNTER — Telehealth: Payer: Self-pay | Admitting: Oncology

## 2013-10-01 NOTE — Telephone Encounter (Signed)
Called Carolyn Garcia and left a message to call back about her MRI results.

## 2013-10-02 ENCOUNTER — Telehealth: Payer: Self-pay | Admitting: Oncology

## 2013-10-02 NOTE — Telephone Encounter (Signed)
Carolyn Garcia called back.  Let her know that her MRI results from 09/29/2013 was good.  She was also concerned about hydrocodone not being able to have refills anymore.  She said Dr. Roselind Messier had given her a script with 5 refills on 9/29 and she does not think the pharmacy will refill it.  Assured her that she can call when she needs a refill and Dr. Roselind Messier will be notified.

## 2013-10-05 ENCOUNTER — Ambulatory Visit
Admission: RE | Admit: 2013-10-05 | Discharge: 2013-10-05 | Disposition: A | Discharge status: Home / Self Care | Payer: Medicare Other | Source: Ambulatory Visit | Attending: Radiation Oncology | Admitting: Radiation Oncology

## 2013-10-05 ENCOUNTER — Telehealth: Payer: Self-pay | Admitting: Oncology

## 2013-10-05 DIAGNOSIS — C50912 Malignant neoplasm of unspecified site of left female breast: Secondary | ICD-10-CM

## 2013-10-05 NOTE — Telephone Encounter (Addendum)
Left a message for Carolyn Garcia to call back at [phone] to give her results of her mammogram and ultrasound.  [name] did call back.  Informed her of good results on her mammogram and ultrasound of her right breast.

## 2013-10-23 ENCOUNTER — Telehealth: Payer: Self-pay | Admitting: Oncology

## 2013-10-23 NOTE — Telephone Encounter (Signed)
Carolyn Garcia called and is asking for a refill on her pain medication- HYDROcodone-acetaminophen (NORCO/VICODIN) 5-325 MG.  She had 5 refills on her last prescription on 09/21/13.  Due to new guidelines, her pharmacy [facility]) will not fill them.  She said that she is currently taking 2-2.5 tablets per day.

## 2013-10-28 ENCOUNTER — Telehealth: Payer: Self-pay | Admitting: *Deleted

## 2013-10-28 ENCOUNTER — Other Ambulatory Visit: Payer: Self-pay | Admitting: Radiation Oncology

## 2013-10-28 MED ORDER — HYDROCODONE-ACETAMINOPHEN 5-325 MG PO TABS: 2.0000 | ORAL_TABLET | Freq: Four times a day (QID) | ORAL | Status: DC | PRN

## 2013-10-28 NOTE — Telephone Encounter (Signed)
Pt called back stating she spoke with the "head pharmacist at [facility]" who told pt that prescription could be written to dispense any amount; dr can write 3 separate prescriptions with different dispense dates on each script. Will route this information to Dr Roselind Messier.

## 2013-10-28 NOTE — Telephone Encounter (Signed)
Returned call from pt who states Carolyn Garcia, [address] will not refill her Hydrocodone 5-325 mg. She states she was informed that she must now have a paper prescription each month for this medication in order for it to be refilled. Pt states she is taking 2 - 2.5 tabs daily. Informed her this RN will route her request to Dr Roselind Messier and call her back when prescription is available to be picked up today. Pt verbalized understanding and appreciation.

## 2013-10-28 NOTE — Telephone Encounter (Signed)
Left vm informing pt prescription is ready for pick up in rad onc nursing, open until 5:30 pm today. Left call back name and number.

## 2013-11-27 ENCOUNTER — Other Ambulatory Visit: Payer: Self-pay | Admitting: *Deleted

## 2013-11-27 DIAGNOSIS — C50919 Malignant neoplasm of unspecified site of unspecified female breast: Secondary | ICD-10-CM

## 2013-12-02 ENCOUNTER — Telehealth: Payer: Self-pay | Admitting: *Deleted

## 2013-12-02 ENCOUNTER — Other Ambulatory Visit: Payer: Self-pay | Admitting: *Deleted

## 2013-12-02 DIAGNOSIS — C50919 Malignant neoplasm of unspecified site of unspecified female breast: Secondary | ICD-10-CM

## 2013-12-02 MED ORDER — ALPRAZOLAM 0.25 MG PO TABS: ORAL_TABLET | ORAL | Status: DC

## 2013-12-02 NOTE — Telephone Encounter (Signed)
Pt called 12/01/13 @ 4:57 pm & left vm that she needs her xanax refilled & doesn't have enough to get to next MD appt in Jan.  She didn't leave a date of birth so RN didn't know which pt she was b/c there were multiple pts in system with this name.  Left message that eve & again this am to call back with a DOB.  She did call back today @ 2pm & left vm with DOB.  Last script filled in Oct with note to get further refills from PCP.  Message left on pt's vm to this effect.

## 2014-01-19 ENCOUNTER — Other Ambulatory Visit: Payer: Medicare Other

## 2014-01-20 ENCOUNTER — Other Ambulatory Visit (HOSPITAL_BASED_OUTPATIENT_CLINIC_OR_DEPARTMENT_OTHER): Payer: Medicare Other

## 2014-01-20 DIAGNOSIS — Z853 Personal history of malignant neoplasm of breast: Secondary | ICD-10-CM

## 2014-01-20 DIAGNOSIS — C50919 Malignant neoplasm of unspecified site of unspecified female breast: Secondary | ICD-10-CM

## 2014-01-20 LAB — CBC WITH DIFFERENTIAL/PLATELET
BASO%: 0.8 % (ref 0.0–2.0)
BASOS ABS: 0 10*3/uL (ref 0.0–0.1)
EOS%: 1.4 % (ref 0.0–7.0)
Eosinophils Absolute: 0.1 10*3/uL (ref 0.0–0.5)
HEMATOCRIT: 41.5 % (ref 34.8–46.6)
HEMOGLOBIN: 13.8 g/dL (ref 11.6–15.9)
LYMPH%: 40.4 % (ref 14.0–49.7)
MCH: 31.4 pg (ref 25.1–34.0)
MCHC: 33.2 g/dL (ref 31.5–36.0)
MCV: 94.4 fL (ref 79.5–101.0)
MONO#: 0.4 10*3/uL (ref 0.1–0.9)
MONO%: 9.5 % (ref 0.0–14.0)
NEUT#: 2.1 10*3/uL (ref 1.5–6.5)
NEUT%: 47.9 % (ref 38.4–76.8)
Platelets: 211 10*3/uL (ref 145–400)
RBC: 4.39 10*6/uL (ref 3.70–5.45)
RDW: 12.4 % (ref 11.2–14.5)
WBC: 4.3 10*3/uL (ref 3.9–10.3)
lymph#: 1.7 10*3/uL (ref 0.9–3.3)

## 2014-01-20 LAB — COMPREHENSIVE METABOLIC PANEL (CC13)
ALK PHOS: 81 U/L (ref 40–150)
ALT: 17 U/L (ref 0–55)
AST: 19 U/L (ref 5–34)
Albumin: 4.3 g/dL (ref 3.5–5.0)
Anion Gap: 11 mEq/L (ref 3–11)
BILIRUBIN TOTAL: 0.44 mg/dL (ref 0.20–1.20)
BUN: 7.5 mg/dL (ref 7.0–26.0)
CALCIUM: 10.2 mg/dL (ref 8.4–10.4)
CO2: 29 mEq/L (ref 22–29)
CREATININE: 0.7 mg/dL (ref 0.6–1.1)
Chloride: 102 mEq/L (ref 98–109)
Glucose: 87 mg/dl (ref 70–140)
Potassium: 3.6 mEq/L (ref 3.5–5.1)
Sodium: 143 mEq/L (ref 136–145)
Total Protein: 7.5 g/dL (ref 6.4–8.3)

## 2014-01-22 ENCOUNTER — Ambulatory Visit: Payer: Medicare Other | Admitting: Oncology

## 2014-01-22 ENCOUNTER — Ambulatory Visit (HOSPITAL_COMMUNITY)
Admission: RE | Admit: 2014-01-22 | Discharge: 2014-01-22 | Disposition: A | Discharge status: Home / Self Care | Payer: Medicare Other | Source: Ambulatory Visit | Attending: Oncology | Admitting: Oncology

## 2014-01-22 ENCOUNTER — Encounter (INDEPENDENT_AMBULATORY_CARE_PROVIDER_SITE_OTHER): Payer: Self-pay

## 2014-01-22 ENCOUNTER — Telehealth: Payer: Self-pay | Admitting: Oncology

## 2014-01-22 ENCOUNTER — Ambulatory Visit (HOSPITAL_BASED_OUTPATIENT_CLINIC_OR_DEPARTMENT_OTHER): Payer: Medicare Other | Admitting: Oncology

## 2014-01-22 VITALS — BP 117/82 | HR 95 | Temp 98.2°F | Resp 18 | Ht 67.0 in | Wt 133.7 lb

## 2014-01-22 DIAGNOSIS — C50919 Malignant neoplasm of unspecified site of unspecified female breast: Secondary | ICD-10-CM

## 2014-01-22 DIAGNOSIS — M069 Rheumatoid arthritis, unspecified: Secondary | ICD-10-CM

## 2014-01-22 DIAGNOSIS — M25559 Pain in unspecified hip: Secondary | ICD-10-CM

## 2014-01-22 DIAGNOSIS — M25551 Pain in right hip: Secondary | ICD-10-CM

## 2014-01-22 DIAGNOSIS — G8929 Other chronic pain: Secondary | ICD-10-CM

## 2014-01-22 DIAGNOSIS — Z853 Personal history of malignant neoplasm of breast: Secondary | ICD-10-CM

## 2014-01-22 DIAGNOSIS — F341 Dysthymic disorder: Secondary | ICD-10-CM

## 2014-01-22 NOTE — Telephone Encounter (Signed)
gv and printd appt sched and avs forpt for July

## 2014-01-22 NOTE — Progress Notes (Signed)
Hematology and Oncology Follow Up Visit  Carolyn Garcia [id number] [birth year] 48 y.o. 01/22/2014 5:36 PM   Principle Diagnosis: Encounter Diagnoses  Name Primary?  . Breast cancer, stage 2   . ADENOCARCINOMA, BREAST, HX OF   . Right hip pain Yes     Interim History:  Follow up visit for this pleasant 48 year old woman with long-standing rheumatoid arthritis who was diagnosed with a stage 2, 2.5 cm primary, 2-node positive, triple negative, poorly differentiated, grade 3,cancer of the left breast, at age 4 in November 2003. She was treated with lumpectomy on 11/13/2002, then chemotherapy with six cycles of Cytoxan/Adriamycin and Taxotere followed by breast radiation.. She has had a chronic pain  syndrome involving the left breast and left axilla. There are no obvious abnormal findings on multiple mammograms or MRIs of the breast.  She continues to have pain in the left breast and left axilla.  Back in the fall of 2014  she developed pain in her right breast. She was imaged both with mammogram and breast ultrasound on 10/05/2013. There were no focal abnormalities. Her rheumatoid arthritis is under reasonable control.She uses prn nonsteroidal anti-inflammatories and an occasional Percocet. She is still getting monthly injections of a TNF inhibitor- Golimumab (Simponi) which she does feel helps and is much better tolerated than Enbrel or methotrexate which she has taken in the recent past.  Over the last few weeks she has been having increasing pain in her right hip and is concerned about this.   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  She denies any persistent headaches, or vaginal bleeding. Her menstruation stopped when she received chemotherapy in the past and never returned.  She felt a small knot in her left thigh.   Medications: reviewed  Allergies:  Allergies  Allergen Reactions  .  Aspirin   . Entex     Review of Systems: Hematology:  No bleeding or bruising ENT ROS: No sore throat Breast ROS: See above Respiratory ROS: No cough or dyspnea Cardiovascular ROS: No chest pain or palpitations   Gastrointestinal ROS: No abdominal pain or change in bowel habit   Genito-Urinary ROS: See above Musculoskeletal ROS: See above Neurological ROS: See above Dermatological ROS: No rash Remaining ROS negative:   Physical Exam: Blood pressure 117/82, pulse 95, temperature 98.2 F (36.8 C), temperature source Oral, resp. rate 18, height 5\' 7"  (1.702 m), weight 133 lb 11.2 oz (60.646 kg). Wt Readings from Last 3 Encounters:  01/22/14 133 lb 11.2 oz (60.646 kg)  09/21/13 136 lb 14.4 oz (62.097 kg)  02/02/13 142 lb 14.4 oz (64.819 kg)     General appearance: Well-nourished Caucasian woman looking younger than her stated age HENNT: Pharynx no erythema, exudate, mass, or ulcer. No thyromegaly or thyroid nodules. Scar and neck from previous partial thyroidectomy. Lymph nodes: No cervical, supraclavicular, or axillary lymphadenopathy Breasts: No abnormal skin changes, no dominant mass in either breast Lungs: Clear to auscultation, resonant to percussion throughout Heart: Regular rhythm, no murmur, no gallop, no rub, no click, no edema Abdomen: Soft, nontender, normal bowel sounds, no mass, no organomegaly Extremities: No edema, no calf tenderness Musculoskeletal: no joint deformities. GU:  Vascular: Carotid pulses 2+, no bruits,  Neurologic: Alert, oriented, PERRLA,  , cranial nerves grossly normal, motor strength 5 over 5, reflexes 1+ symmetric, upper body coordination normal, gait normal, Skin: No rash or ecchymosis. I Was unable to  palpate any nodules in the left thigh area.  Lab Results: CBC W/Diff    Component Value Date/Time   WBC 4.3 01/20/2014 1524   RBC 4.39 01/20/2014 1524   HGB 13.8 01/20/2014 1524   HGB 12.8 05/10/2009 1352   HCT 41.5 01/20/2014 1524   PLT 211  01/20/2014 1524   MCV 94.4 01/20/2014 1524   MCH 31.4 01/20/2014 1524   MCH 32.3 01/11/2011 1513   MCHC 33.2 01/20/2014 1524   RDW 12.4 01/20/2014 1524   LYMPHSABS 1.7 01/20/2014 1524   MONOABS 0.4 01/20/2014 1524   EOSABS 0.1 01/20/2014 1524   BASOSABS 0.0 01/20/2014 1524     Chemistry      Component Value Date/Time   NA 143 01/20/2014 1524   NA 138 07/18/2012 1523   K 3.6 01/20/2014 1524   K 3.4* 07/18/2012 1523   CL 104 01/19/2013 1604   CL 101 07/18/2012 1523   CO2 29 01/20/2014 1524   CO2 29 07/18/2012 1523   BUN 7.5 01/20/2014 1524   BUN 9 07/18/2012 1523   CREATININE 0.7 01/20/2014 1524   CREATININE 0.88 07/18/2012 1523      Component Value Date/Time   CALCIUM 10.2 01/20/2014 1524   CALCIUM 9.6 07/18/2012 1523   ALKPHOS 81 01/20/2014 1524   ALKPHOS 70 07/11/2012 1544   AST 19 01/20/2014 1524   AST 17 07/11/2012 1544   ALT 17 01/20/2014 1524   ALT 12 07/11/2012 1544   BILITOT 0.44 01/20/2014 1524   BILITOT 0.4 07/11/2012 1544       Radiological Studies: Dg Hip Complete Right  01/22/2014   CLINICAL DATA:  Right hip pain.  EXAM: RIGHT HIP - COMPLETE 2+ VIEW  COMPARISON:  None.  FINDINGS: There is no evidence of hip fracture or dislocation. There is no evidence of arthropathy or other focal bone abnormality.  IMPRESSION: Negative.   Electronically Signed   By: Kerby Moors M.D.   On: 01/22/2014 16:48    Impression:  #1. Stage II triple negative breast cancer treated as outlined above  She remains free of any obvious new disease now out a remarkable 11 years from diagnosis in November 2003.   #2. Rheumatoid arthritis   #3. 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 Chronic anxiety and depression related to #3.   #5. History of Hashimoto's thyroiditis status post right hemithyroidectomy now hypothyroid on replacement.  #6. Right hip pain. No obvious pathologic findings on regular x-rays done today.  I will transition her care to Dr. Humphrey Rolls    CC: Patient Care  Team: Tommy Medal, MD as PCP - General (Internal Medicine)   Annia Belt, MD 1/30/20155:36 PM

## 2014-01-25 ENCOUNTER — Telehealth: Payer: Self-pay | Admitting: *Deleted

## 2014-01-25 NOTE — Telephone Encounter (Signed)
Message copied by Ignacia Felling on Mon Jan 25, 2014  3:11 PM ------      Message from: Carolyn Garcia      Created: Fri Jan 22, 2014  6:15 PM       Call pt: no abnormalities on regular hip films ------

## 2014-01-25 NOTE — Telephone Encounter (Signed)
Spoke with patient.  Let her know there are no abnormalities on regular hip films.  She will see her rheumatologist this month and see if they have any ideas as to the source of her hip pain.  She appreciated the call.

## 2014-02-01 ENCOUNTER — Ambulatory Visit: Payer: Self-pay | Admitting: Radiation Oncology

## 2014-02-15 ENCOUNTER — Encounter: Payer: Self-pay | Admitting: Radiation Oncology

## 2014-02-15 ENCOUNTER — Ambulatory Visit
Admission: RE | Admit: 2014-02-15 | Discharge: 2014-02-15 | Disposition: A | Discharge status: Home / Self Care | Payer: Medicare Other | Source: Ambulatory Visit | Attending: Radiation Oncology | Admitting: Radiation Oncology

## 2014-02-15 VITALS — BP 111/82 | HR 91 | Temp 98.0°F | Ht 67.0 in | Wt 134.1 lb

## 2014-02-15 DIAGNOSIS — C50919 Malignant neoplasm of unspecified site of unspecified female breast: Secondary | ICD-10-CM

## 2014-02-15 MED ORDER — HYDROCODONE-ACETAMINOPHEN 5-325 MG PO TABS: 2.0000 | ORAL_TABLET | Freq: Four times a day (QID) | ORAL | Status: DC | PRN

## 2014-02-15 NOTE — Progress Notes (Signed)
Carolyn Garcia is here today c/o of continued achy, pulsing, stinging like pain in her upper chest above her breast and mid back.  She also c/o pain in her right hip which wakes her up at night and she had a xray of this hip in January 2015 with no abnormalities noted.

## 2014-02-15 NOTE — Progress Notes (Signed)
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  She continues to have chronic pain both breasts, more so in the upper aspect. She also complains of tightness in the left axillary region. She denies any nipple discharge or bleeding. Patient is been having pain in the right pelvis region which will awaken her at night. She did undergo plain x-rays which showed no obvious problem in this area. She is concerned about recurrence in this area.                              ALLERGIES:  is allergic to aspirin and entex.  Meds: Current Outpatient Prescriptions  Medication Sig Dispense Refill  . ALPRAZolam (XANAX) 0.25 MG tablet TAKE 1 TABLET BY MOUTH EVERY 6 HOURS AS NEEDED FOR ANXIETY *Future refills per Primary Care MD*  50 tablet  0  . benzonatate (TESSALON) 200 MG capsule Take 200 mg by mouth 3 (three) times daily as needed.      . calcium citrate-vitamin D 200-200 MG-UNIT TABS Take 2 tablets by mouth daily.      . cholecalciferol (VITAMIN D) 1000 UNITS tablet Take 2,000 Units by mouth daily.      . clotrimazole-betamethasone (LOTRISONE) cream Apply 1 application topically as needed.       . cyclobenzaprine (FLEXERIL) 10 MG tablet Take 10 mg by mouth daily as needed.       . diazepam (VALIUM) 5 MG tablet Take 5 mg by mouth every 6 (six) hours as needed for anxiety. Only uses for MRI      . Golimumab (SIMPONI) 50 MG/0.5ML SOLN Inject 50 mg into the skin every 30 (thirty) days.      Marland Kitchen HYDROcodone-acetaminophen (NORCO/VICODIN) 5-325 MG per tablet Take 2 tablets by mouth every 6 (six) hours as needed.  360 tablet  0  . hyoscyamine  (ANASPAZ) 0.125 MG TBDP disintergrating tablet Place 0.125 mg under the tongue every 6 (six) hours as needed.      Marland Kitchen levothyroxine (SYNTHROID, LEVOTHROID) 75 MCG tablet Take 75 mcg by mouth daily before breakfast.      . LIDODERM 5 % Place onto the skin as needed.      . Multiple Vitamin (MULTIVITAMIN) tablet Take 1 tablet by mouth daily.      . nortriptyline (PAMELOR) 25 MG capsule        No current facility-administered medications for this encounter.    Physical Findings: The patient is in no acute distress. Patient is alert and oriented.  height is 5\' 7"  (1.702 m) and weight is 134 lb 1.6 oz (60.827 kg). Her temperature is 98 F (36.7 C). Her blood pressure is 111/82 and her pulse is 91. Marland Kitchen  No palpable supraclavicular or axillary adenopathy. The lungs are clear to auscultation. The heart has a regular rhythm and rate. Examination right breast reveals no palpable mass or nipple discharge. Examination of the left breast reveals some mild intention at her lumpectomy site which is been noticed on previous exams.  No palpable mass within the breast nipple discharge or bleeding. Patient continues to have  discomfort with the examination.  Lab Findings: Lab Results  Component Value Date   WBC 4.3 01/20/2014   HGB 13.8 01/20/2014   HCT 41.5 01/20/2014   MCV 94.4 01/20/2014   PLT 211 01/20/2014     Radiographic Findings: Dg Hip Complete Right  01/22/2014   CLINICAL DATA:  Right hip pain.  EXAM: RIGHT HIP - COMPLETE 2+ VIEW  COMPARISON:  None.  FINDINGS: There is no evidence of hip fracture or dislocation. There is no evidence of arthropathy or other focal bone abnormality.  IMPRESSION: Negative.   Electronically Signed   By: Kerby Moors M.D.   On: 01/22/2014 16:48    Impression:  No evidence of recurrence on clinical exam today.  Plan:  Routine followup in May of this year. Patient will be seen by medical oncology in July.  She will be scheduled for MRI of the pelvis region. She will also  proceed with scheduling her yearly mammograms.  ____________________________________ Blair Promise, MD

## 2014-02-16 ENCOUNTER — Telehealth: Payer: Self-pay | Admitting: *Deleted

## 2014-02-16 NOTE — Telephone Encounter (Signed)
CALLED PATIENT TO INFORM OF TEST, LVM FOR A RETURN CALL 

## 2014-02-20 ENCOUNTER — Encounter: Payer: Self-pay | Admitting: Oncology

## 2014-02-22 ENCOUNTER — Telehealth: Payer: Self-pay | Admitting: Oncology

## 2014-02-22 NOTE — Telephone Encounter (Signed)
Called in refill per Dr. Sondra Come for ALPRAZolam Duanne Moron) 0.25 MG tablet Sig: TAKE 1 TABLET BY MOUTH EVERY 6 HOURS AS NEEDED FOR ANXIETY. Disp 50 tablets. 0 refills.  Called in to [facility] on [address].  Called Kalila back to let her know it has been called in.

## 2014-02-22 NOTE — Telephone Encounter (Signed)
Carolyn Garcia called asking if she can get a refill on xanax.  She said Dr. Beryle Beams used to fill her prescription but he has moved to another position.  She is currently taking 1/2 tablet as needed mainly at night.  She is wondering if Dr. Sondra Come can refill the prescription because she will need it for her upcoming MRI on 03/01/14.  She says she has one half tablet left.  She last had it filled on 12/02/13.  She also has valium on her med list that was given 02/03/13 by Dr. Beryle Beams.

## 2014-02-24 NOTE — Telephone Encounter (Signed)
error 

## 2014-03-01 ENCOUNTER — Ambulatory Visit (HOSPITAL_COMMUNITY)
Admission: RE | Admit: 2014-03-01 | Discharge: 2014-03-01 | Disposition: A | Discharge status: Home / Self Care | Payer: Medicare Other | Source: Ambulatory Visit | Attending: Radiation Oncology | Admitting: Radiation Oncology

## 2014-03-01 DIAGNOSIS — M76899 Other specified enthesopathies of unspecified lower limb, excluding foot: Secondary | ICD-10-CM | POA: Insufficient documentation

## 2014-03-01 DIAGNOSIS — M25559 Pain in unspecified hip: Secondary | ICD-10-CM | POA: Insufficient documentation

## 2014-03-01 DIAGNOSIS — C50919 Malignant neoplasm of unspecified site of unspecified female breast: Secondary | ICD-10-CM | POA: Insufficient documentation

## 2014-03-01 MED ORDER — GADOBENATE DIMEGLUMINE 529 MG/ML IV SOLN
13.0000 mL | Freq: Once | INTRAVENOUS | Status: AC | PRN
  Administered 2014-03-01: 13 mL via INTRAVENOUS

## 2014-03-05 ENCOUNTER — Other Ambulatory Visit: Payer: Self-pay | Admitting: Oncology

## 2014-03-05 ENCOUNTER — Telehealth: Payer: Self-pay | Admitting: Oncology

## 2014-03-05 NOTE — Telephone Encounter (Signed)
Called Dr. Ruel Favors office regarding a referral from Dr. Sondra Come.  He will be able to see her next week and will give her a call to schedule an appointment.  Called Carolyn Garcia and left a message telling her to expect a call from Dr. Ruel Favors office.

## 2014-03-09 ENCOUNTER — Other Ambulatory Visit: Payer: Self-pay

## 2014-03-09 DIAGNOSIS — Z9889 Other specified postprocedural states: Secondary | ICD-10-CM

## 2014-03-09 DIAGNOSIS — Z853 Personal history of malignant neoplasm of breast: Secondary | ICD-10-CM

## 2014-03-09 DIAGNOSIS — Z1231 Encounter for screening mammogram for malignant neoplasm of breast: Secondary | ICD-10-CM

## 2014-03-10 ENCOUNTER — Telehealth: Payer: Self-pay | Admitting: *Deleted

## 2014-03-10 NOTE — Telephone Encounter (Signed)
Called patient to give info., lvm for a return call. 

## 2014-03-11 ENCOUNTER — Telehealth: Payer: Self-pay | Admitting: *Deleted

## 2014-03-11 NOTE — Telephone Encounter (Signed)
Madison from [facility]'s office called and asked for [facility],  stated their regular fax machine has been out of service  Past coupe of days requesting most recent MRI pelviis of patient to be faxed to this machine [phone],  Faxed report to her attention 4:02 PM

## 2014-03-23 ENCOUNTER — Telehealth: Payer: Self-pay | Admitting: Internal Medicine

## 2014-03-23 ENCOUNTER — Ambulatory Visit
Admission: RE | Admit: 2014-03-23 | Discharge: 2014-03-23 | Disposition: A | Discharge status: Home / Self Care | Payer: Medicare Other | Source: Ambulatory Visit

## 2014-03-23 DIAGNOSIS — Z853 Personal history of malignant neoplasm of breast: Secondary | ICD-10-CM

## 2014-03-23 DIAGNOSIS — Z9889 Other specified postprocedural states: Secondary | ICD-10-CM

## 2014-03-23 DIAGNOSIS — Z1231 Encounter for screening mammogram for malignant neoplasm of breast: Secondary | ICD-10-CM

## 2014-03-23 NOTE — Telephone Encounter (Signed)
NEW CALENDAR MAILED.  °

## 2014-05-10 ENCOUNTER — Ambulatory Visit
Admission: RE | Admit: 2014-05-10 | Discharge: 2014-05-10 | Disposition: A | Discharge status: Home / Self Care | Payer: Medicare Other | Source: Ambulatory Visit | Attending: Radiation Oncology | Admitting: Radiation Oncology

## 2014-05-10 ENCOUNTER — Encounter: Payer: Self-pay | Admitting: Radiation Oncology

## 2014-05-10 VITALS — BP 107/78 | HR 98 | Temp 97.8°F | Ht 67.0 in | Wt 130.0 lb

## 2014-05-10 DIAGNOSIS — C50919 Malignant neoplasm of unspecified site of unspecified female breast: Secondary | ICD-10-CM

## 2014-05-10 MED ORDER — HYDROCODONE-ACETAMINOPHEN 5-325 MG PO TABS: 2.0000 | ORAL_TABLET | Freq: Four times a day (QID) | ORAL | Status: DC | PRN

## 2014-05-10 NOTE — Progress Notes (Signed)
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  She continues to be anxious about possible recurrence and wishes continued followup in radiation oncology. She continues to have chronic pain in both breasts at this time more so along the right side which was opposite side of her treatment. She denies any nipple discharge or bleeding. She did undergo mammography earlier this spring with no suspicious changes in either breast.  She will be following up with Dr. Jana Hakim in light of Dr Azucena Freed retirement.  She does have a lot of stress in her life at this time with her handicapped brother and aging parents,   All of which she is taking care of.                      ALLERGIES:  is allergic to aspirin and entex.  Meds: Current Outpatient Prescriptions  Medication Sig Dispense Refill  . ALPRAZolam (XANAX) 0.25 MG tablet TAKE 1 TABLET BY MOUTH EVERY 6 HOURS AS NEEDED FOR ANXIETY.  Called in to [facility] on [address] per Dr. Sondra Come.      . benzonatate (TESSALON) 200 MG capsule Take 200 mg by mouth 3 (three) times daily as needed.      . calcium citrate-vitamin D 200-200 MG-UNIT TABS Take 2 tablets by mouth daily.      . cholecalciferol (VITAMIN D) 1000 UNITS tablet Take 2,000 Units by mouth daily.      . cyclobenzaprine (FLEXERIL) 10 MG tablet Take 10 mg by mouth daily as needed.       . Golimumab (SIMPONI) 50 MG/0.5ML SOLN Inject 50 mg into the skin every 30 (thirty) days.      Marland Kitchen HYDROcodone-acetaminophen (NORCO/VICODIN) 5-325 MG per tablet Take 2 tablets by mouth every 6 (six) hours as needed.  360 tablet  0  . hyoscyamine (ANASPAZ)  0.125 MG TBDP disintergrating tablet Place 0.125 mg under the tongue every 6 (six) hours as needed.      Marland Kitchen ipratropium (ATROVENT) 0.03 % nasal spray       . levothyroxine (SYNTHROID, LEVOTHROID) 75 MCG tablet Take 75 mcg by mouth daily before breakfast.      . LIDODERM 5 % Place onto the skin as needed.      . Multiple Vitamin (MULTIVITAMIN) tablet Take 1 tablet by mouth daily.      . clotrimazole-betamethasone (LOTRISONE) cream Apply 1 application topically as needed.       . diazepam (VALIUM) 5 MG tablet Take 5 mg by mouth every 6 (six) hours as needed for anxiety. Only uses for MRI      . nortriptyline (PAMELOR) 25 MG capsule        No current facility-administered medications for this encounter.    Physical Findings: The patient is in no acute distress. Patient is alert and oriented.  height is 5\' 7"  (1.702 m) and weight is 130 lb (58.968 kg). Her oral temperature is 97.8 F (36.6 C). Her blood pressure is 107/78 and her pulse is 98. Marland Kitchen  No palpable cervical supraclavicular or axillary adenopathy. The lungs clear to auscultation. The heart has a regular rhythm and  rate. Diminished right breast reveals no palpable mass or nipple discharge. Examination of the left breast reveals some mild radiation changes. Patient has mild indention at the lumpectomy site which has been noted on previous exams. Mild induration noted adjacent to the lumpectomy scar which is painful with palpation. No dominant mass is appreciated breast nipple discharge or bleeding. Continues to complain of diffuse pain in both breasts with examination.  Lab Findings: Lab Results  Component Value Date   WBC 4.3 01/20/2014   HGB 13.8 01/20/2014   HCT 41.5 01/20/2014   MCV 94.4 01/20/2014   PLT 211 01/20/2014      Radiographic Findings: No results found.  Impression:  No evidence of recurrence on clinical exam today.  Chronic breast pain as above.  Plan:  Routine followup in 6 months. Continue hydrocodone for breast  pain.  ____________________________________ Blair Promise, MD

## 2014-05-10 NOTE — Progress Notes (Addendum)
Carolyn Garcia here for follow up after treatment to her left breast.  She reports that she has pain in both breasts that she is rating at a 6/10.  She reports this pain comes and goes and is throbbing pain.  She said she has had this pain for a long time.  She reports that she had to stop physical therapy due to her fibromyalgia and will start again next week.  The skin on her left breast is intact.   She reports a poor appetite.  She reports being fatigued.

## 2014-07-15 ENCOUNTER — Other Ambulatory Visit: Payer: Self-pay | Admitting: Medical Oncology

## 2014-07-15 ENCOUNTER — Ambulatory Visit: Payer: Medicare Other

## 2014-07-16 ENCOUNTER — Telehealth: Payer: Self-pay | Admitting: Internal Medicine

## 2014-07-16 NOTE — Telephone Encounter (Signed)
lvm for pt regarding to July appt. °

## 2014-07-20 ENCOUNTER — Telehealth: Payer: Self-pay | Admitting: Hematology and Oncology

## 2014-07-20 NOTE — Telephone Encounter (Signed)
pt initially called last week and s/w me re 7/31 appt. per pt she was suppose to be transferred to [place] from [facility] when he left but was then moved to [place]. per pt she was not aware she would be seeing [place] and didn't know why her appt was moved. I apologized and informed pt that DC would be leaving soon and pt requested that she be able to do some research and call me back to let me know what she wants to do. pt called back today and stated she heard we were had a new physician coming - Dr Lindi Adie and wanted to know if she could see him. Per she was seeing [place] for [facility]. Pt given appt for lab 8/27 and f/u w/[facility] 9/3. Pt appreciative of being able to be scheduled w/[facility] and voiced she just didn't want another doctor who may be leaving.

## 2014-07-22 ENCOUNTER — Ambulatory Visit: Payer: Medicare Other

## 2014-08-10 ENCOUNTER — Telehealth: Payer: Self-pay | Admitting: Oncology

## 2014-08-10 NOTE — Telephone Encounter (Signed)
Carolyn Garcia left a voice mail message requesting a refill on HYDROcodone-acetaminophen (NORCO/VICODIN) 5-325 MG per tablet.  She is requesting a call back when prescription is ready to be picked up.

## 2014-08-11 ENCOUNTER — Telehealth: Payer: Self-pay | Admitting: Oncology

## 2014-08-11 ENCOUNTER — Other Ambulatory Visit: Payer: Self-pay | Admitting: Radiation Oncology

## 2014-08-11 MED ORDER — HYDROCODONE-ACETAMINOPHEN 5-325 MG PO TABS: 2.0000 | ORAL_TABLET | Freq: Four times a day (QID) | ORAL | Status: DC | PRN

## 2014-08-11 NOTE — Telephone Encounter (Signed)
Called Carolyn Garcia and left a message that her prescription for hydrocodone/acetaminophen is ready for pick up in the Radiation nursing area.

## 2014-08-19 ENCOUNTER — Other Ambulatory Visit: Payer: Medicare Other

## 2014-08-20 ENCOUNTER — Other Ambulatory Visit: Payer: Self-pay | Admitting: *Deleted

## 2014-08-20 DIAGNOSIS — C50919 Malignant neoplasm of unspecified site of unspecified female breast: Secondary | ICD-10-CM

## 2014-08-23 ENCOUNTER — Other Ambulatory Visit (HOSPITAL_BASED_OUTPATIENT_CLINIC_OR_DEPARTMENT_OTHER): Payer: Medicare Other

## 2014-08-23 DIAGNOSIS — Z853 Personal history of malignant neoplasm of breast: Secondary | ICD-10-CM

## 2014-08-23 DIAGNOSIS — C50919 Malignant neoplasm of unspecified site of unspecified female breast: Secondary | ICD-10-CM

## 2014-08-23 LAB — COMPREHENSIVE METABOLIC PANEL (CC13)
ALT: 17 U/L (ref 0–55)
ANION GAP: 7 meq/L (ref 3–11)
AST: 17 U/L (ref 5–34)
Albumin: 4.1 g/dL (ref 3.5–5.0)
Alkaline Phosphatase: 68 U/L (ref 40–150)
BUN: 6.7 mg/dL — ABNORMAL LOW (ref 7.0–26.0)
CO2: 30 meq/L — AB (ref 22–29)
Calcium: 9.8 mg/dL (ref 8.4–10.4)
Chloride: 103 mEq/L (ref 98–109)
Creatinine: 0.8 mg/dL (ref 0.6–1.1)
GLUCOSE: 80 mg/dL (ref 70–140)
POTASSIUM: 3.7 meq/L (ref 3.5–5.1)
SODIUM: 140 meq/L (ref 136–145)
TOTAL PROTEIN: 7.2 g/dL (ref 6.4–8.3)
Total Bilirubin: 0.39 mg/dL (ref 0.20–1.20)

## 2014-08-23 LAB — CBC WITH DIFFERENTIAL/PLATELET
BASO%: 0.7 % (ref 0.0–2.0)
Basophils Absolute: 0 10*3/uL (ref 0.0–0.1)
EOS%: 1.3 % (ref 0.0–7.0)
Eosinophils Absolute: 0.1 10*3/uL (ref 0.0–0.5)
HCT: 39.5 % (ref 34.8–46.6)
HGB: 13.2 g/dL (ref 11.6–15.9)
LYMPH%: 33.9 % (ref 14.0–49.7)
MCH: 31.5 pg (ref 25.1–34.0)
MCHC: 33.4 g/dL (ref 31.5–36.0)
MCV: 94.3 fL (ref 79.5–101.0)
MONO#: 0.5 10*3/uL (ref 0.1–0.9)
MONO%: 10.9 % (ref 0.0–14.0)
NEUT%: 53.2 % (ref 38.4–76.8)
NEUTROS ABS: 2.4 10*3/uL (ref 1.5–6.5)
PLATELETS: 194 10*3/uL (ref 145–400)
RBC: 4.19 10*6/uL (ref 3.70–5.45)
RDW: 12.1 % (ref 11.2–14.5)
WBC: 4.6 10*3/uL (ref 3.9–10.3)
lymph#: 1.6 10*3/uL (ref 0.9–3.3)

## 2014-08-26 ENCOUNTER — Telehealth: Payer: Self-pay | Admitting: Hematology and Oncology

## 2014-08-26 ENCOUNTER — Ambulatory Visit (HOSPITAL_BASED_OUTPATIENT_CLINIC_OR_DEPARTMENT_OTHER): Payer: Medicare Other | Admitting: Hematology and Oncology

## 2014-08-26 ENCOUNTER — Other Ambulatory Visit: Payer: Self-pay | Admitting: Hematology and Oncology

## 2014-08-26 VITALS — BP 124/73 | HR 95 | Temp 98.7°F | Resp 18 | Ht 67.0 in | Wt 115.2 lb

## 2014-08-26 DIAGNOSIS — Z1231 Encounter for screening mammogram for malignant neoplasm of breast: Secondary | ICD-10-CM

## 2014-08-26 DIAGNOSIS — Z853 Personal history of malignant neoplasm of breast: Secondary | ICD-10-CM

## 2014-08-26 DIAGNOSIS — E559 Vitamin D deficiency, unspecified: Secondary | ICD-10-CM

## 2014-08-26 DIAGNOSIS — M549 Dorsalgia, unspecified: Secondary | ICD-10-CM

## 2014-08-26 DIAGNOSIS — IMO0001 Reserved for inherently not codable concepts without codable children: Secondary | ICD-10-CM

## 2014-08-26 NOTE — Assessment & Plan Note (Signed)
Left breast cancer stage II 2.5 cm primary 2 lymph nodes positive triple negative well-differentiated grade 3 diagnosed November 2003: With lumpectomy and 6 cycles of chemotherapy followed by radiation: Currently on surveillance getting annual mammograms. Her last mammogram was done in April 2015. I reviewed the results of which was normal.  Diffuse body aches and pains and pain in the back: I reviewed the results of previous scans including the MRI of her pelvis done in March 2015. There was no evidence of metastatic disease. Hence I do not believe there is any need to do bone scans. Her symptoms are being managed by her primary care physician and rheumatologist.  Return to clinic in April 2016 with mammogram and followup to do a breast exam.

## 2014-08-26 NOTE — Telephone Encounter (Signed)
per pof to sch pt mamma/appts-gave pt copy of sch

## 2014-08-26 NOTE — Progress Notes (Signed)
Patient Care Team: Tommy Medal, MD as PCP - General (Internal Medicine) Hennie Duos, MD as Consulting Physician (Rheumatology) Deatra Robinson, MD as Consulting Physician (Oncology)  DIAGNOSIS: No matching staging information was found for the patient.  SUMMARY OF ONCOLOGIC HISTORY:   Breast cancer, stage 2   01/18/2012 Initial Diagnosis Breast cancer, stage 2    CHIEF COMPLIANT: Diffuse body pains especially in the lower back and left hip  INTERVAL HISTORY: Carolyn Garcia is a 48 year old Caucasian lady with above-mentioned history of stage II breast cancer that was diagnosed and treated by Dr. Beryle Beams in 2003. She has been followed annually with mammograms. Her last mammogram was done in April which was normal. She has had chronic body pains in different parts of her body especially the pelvis and lower back. She had an MRI of her back done in March 2015 which was negative for malignancy. She has had previous bone scans in 2012 which were normal. She is scheduled to see a rheumatologist to get treatment for this issue. She reports that she is continuing to take antiestrogen therapy without any major problems.   REVIEW OF SYSTEMS:   Constitutional: Denies fevers, chills or abnormal weight loss Eyes: Denies blurriness of vision Ears, nose, mouth, throat, and face: Denies mucositis or sore throat Respiratory: Denies cough, dyspnea or wheezes Cardiovascular: Denies palpitation, chest discomfort or lower extremity swelling Gastrointestinal:  Denies nausea, heartburn or change in bowel habits Skin: Denies abnormal skin rashes Lymphatics: Denies new lymphadenopathy or easy bruising Neurological:Denies numbness, tingling or new weaknesses Behavioral/Psych: Mood is stable, no new changes  Breast:  denies any pain or lumps or nodules in either breasts All other systems were reviewed with the patient and are negative.  I have reviewed the past medical history, past surgical history, social  history and family history with the patient and they are unchanged from previous note.  ALLERGIES:  is allergic to aspirin and entex.  MEDICATIONS:  Current Outpatient Prescriptions  Medication Sig Dispense Refill  . ALPRAZolam (XANAX) 0.25 MG tablet TAKE 1 TABLET BY MOUTH EVERY 6 HOURS AS NEEDED FOR ANXIETY.  Called in to [facility] on [address] per Dr. Sondra Come.      . benzonatate (TESSALON) 200 MG capsule Take 200 mg by mouth 3 (three) times daily as needed.      . calcium citrate-vitamin D 200-200 MG-UNIT TABS Take 2 tablets by mouth daily.      . cholecalciferol (VITAMIN D) 1000 UNITS tablet Take 2,000 Units by mouth daily.      . cyclobenzaprine (FLEXERIL) 10 MG tablet Take 10 mg by mouth daily as needed.       . Golimumab (SIMPONI) 50 MG/0.5ML SOLN Inject 50 mg into the skin every 30 (thirty) days.      Marland Kitchen HYDROcodone-acetaminophen (NORCO/VICODIN) 5-325 MG per tablet Take 2 tablets by mouth every 6 (six) hours as needed.  360 tablet  0  . hyoscyamine (ANASPAZ) 0.125 MG TBDP disintergrating tablet Place 0.125 mg under the tongue every 6 (six) hours as needed.      Marland Kitchen ipratropium (ATROVENT) 0.03 % nasal spray       . levothyroxine (SYNTHROID, LEVOTHROID) 75 MCG tablet Take 75 mcg by mouth daily before breakfast.      . LIDODERM 5 % Place onto the skin as needed.      . montelukast (SINGULAIR) 10 MG tablet       . Multiple Vitamin (MULTIVITAMIN) tablet Take 1 tablet by mouth daily.      Marland Kitchen  nortriptyline (PAMELOR) 25 MG capsule       . clotrimazole-betamethasone (LOTRISONE) cream Apply 1 application topically as needed.       . diazepam (VALIUM) 5 MG tablet Take 5 mg by mouth every 6 (six) hours as needed for anxiety. Only uses for MRI       No current facility-administered medications for this visit.    PHYSICAL EXAMINATION: ECOG PERFORMANCE STATUS: 1 - Symptomatic but completely ambulatory  Filed Vitals:   08/26/14 1407  BP: 124/73  Pulse: 95  Temp: 98.7 F (37.1 C)  Resp: 18    Filed Weights   08/26/14 1407  Weight: 115 lb 3 oz (52.249 kg)    GENERAL:alert, no distress and comfortable SKIN: skin color, texture, turgor are normal, no rashes or significant lesions EYES: normal, Conjunctiva are pink and non-injected, sclera clear OROPHARYNX:no exudate, no erythema and lips, buccal mucosa, and tongue normal  NECK: supple, thyroid normal size, non-tender, without nodularity LYMPH:  no palpable lymphadenopathy in the cervical, axillary or inguinal LUNGS: clear to auscultation and percussion with normal breathing effort HEART: regular rate & rhythm and no murmurs and no lower extremity edema ABDOMEN:abdomen soft, non-tender and normal bowel sounds Musculoskeletal:no cyanosis of digits and no clubbing  NEURO: alert & oriented x 3 with fluent speech, no focal motor/sensory deficits   LABORATORY DATA:  I have reviewed the data as listed   Chemistry      Component Value Date/Time   NA 140 08/23/2014 1610   NA 138 07/18/2012 1523   K 3.7 08/23/2014 1610   K 3.4* 07/18/2012 1523   CL 104 01/19/2013 1604   CL 101 07/18/2012 1523   CO2 30* 08/23/2014 1610   CO2 29 07/18/2012 1523   BUN 6.7* 08/23/2014 1610   BUN 9 07/18/2012 1523   CREATININE 0.8 08/23/2014 1610   CREATININE 0.88 07/18/2012 1523      Component Value Date/Time   CALCIUM 9.8 08/23/2014 1610   CALCIUM 9.6 07/18/2012 1523   ALKPHOS 68 08/23/2014 1610   ALKPHOS 70 07/11/2012 1544   AST 17 08/23/2014 1610   AST 17 07/11/2012 1544   ALT 17 08/23/2014 1610   ALT 12 07/11/2012 1544   BILITOT 0.39 08/23/2014 1610   BILITOT 0.4 07/11/2012 1544       Lab Results  Component Value Date   WBC 4.6 08/23/2014   HGB 13.2 08/23/2014   HCT 39.5 08/23/2014   MCV 94.3 08/23/2014   PLT 194 08/23/2014   NEUTROABS 2.4 08/23/2014     RADIOGRAPHIC STUDIES: I have personally reviewed the radiology reports and agreed with their findings. No results found.   ASSESSMENT & PLAN:  Breast cancer, stage 2 Left breast cancer  stage II 2.5 cm primary 2 lymph nodes positive triple negative well-differentiated grade 3 diagnosed November 2003: With lumpectomy and 6 cycles of chemotherapy followed by radiation: Currently on surveillance getting annual mammograms. Her last mammogram was done in April 2015. I reviewed the results of which was normal.  Diffuse body aches and pains and pain in the back: I reviewed the results of previous scans including the MRI of her pelvis done in March 2015. There was no evidence of metastatic disease. Hence I do not believe there is any need to do bone scans. Her symptoms are being managed by her primary care physician and rheumatologist.  Return to clinic in April 2016 with mammogram and followup to do a breast exam.   Orders Placed This Encounter  Procedures  .  CBC with Differential    Standing Status: Future     Number of Occurrences:      Standing Expiration Date: 08/26/2015  . Comprehensive metabolic panel (Cmet) - [facility]    Standing Status: Future     Number of Occurrences:      Standing Expiration Date: 08/26/2015   The patient has a good understanding of the overall plan. she agrees with it. She will call with any problems that may develop before her next visit here.  I spent 25 minutes counseling the patient face to face. The total time spent in the appointment was 30 minutes and more than 50% was on counseling and review of test results    Carolyn Eisenmenger, MD 08/26/2014 3:31 PM

## 2014-08-31 ENCOUNTER — Other Ambulatory Visit: Payer: Self-pay | Admitting: Internal Medicine

## 2014-08-31 DIAGNOSIS — M542 Cervicalgia: Secondary | ICD-10-CM

## 2014-08-31 DIAGNOSIS — M545 Low back pain, unspecified: Secondary | ICD-10-CM

## 2014-09-15 ENCOUNTER — Ambulatory Visit
Admission: RE | Admit: 2014-09-15 | Discharge: 2014-09-15 | Disposition: A | Discharge status: Home / Self Care | Payer: Medicare Other | Source: Ambulatory Visit | Attending: Internal Medicine | Admitting: Internal Medicine

## 2014-09-15 DIAGNOSIS — M542 Cervicalgia: Secondary | ICD-10-CM

## 2014-09-15 DIAGNOSIS — M545 Low back pain, unspecified: Secondary | ICD-10-CM

## 2014-09-17 ENCOUNTER — Telehealth: Payer: Self-pay | Admitting: *Deleted

## 2014-09-17 NOTE — Telephone Encounter (Signed)
Received MRI results from [facility]. Sent to scan.

## 2014-10-25 ENCOUNTER — Ambulatory Visit
Admission: RE | Admit: 2014-10-25 | Discharge: 2014-10-25 | Disposition: A | Discharge status: Home / Self Care | Payer: Medicare Other | Source: Ambulatory Visit | Attending: Radiation Oncology | Admitting: Radiation Oncology

## 2014-10-25 ENCOUNTER — Encounter: Payer: Self-pay | Admitting: Radiation Oncology

## 2014-10-25 ENCOUNTER — Inpatient Hospital Stay: Admission: RE | Admit: 2014-10-25 | Payer: Self-pay | Source: Ambulatory Visit | Admitting: Radiation Oncology

## 2014-10-25 VITALS — BP 109/76 | HR 91 | Temp 98.1°F | Resp 16 | Ht 67.0 in | Wt 114.9 lb

## 2014-10-25 DIAGNOSIS — C50912 Malignant neoplasm of unspecified site of left female breast: Secondary | ICD-10-CM

## 2014-10-25 MED ORDER — HYDROCODONE-ACETAMINOPHEN 5-325 MG PO TABS: 2.0000 | ORAL_TABLET | Freq: Four times a day (QID) | ORAL | Status: DC | PRN

## 2014-10-25 NOTE — Progress Notes (Signed)
Radiation Oncology         [phone] ________________________________  Name: Carolyn Garcia MRN: [id number]  Date: 10/25/2014  DOB: 1966/04/30  Follow-Up Visit Note  CC: Tommy Medal, MD  Tommy Medal, MD    ICD-9-CM ICD-10-CM   1. 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  She continues to be anxious and under a lot of stress related to her family situation. She has had some pain in the neck and lower back area. She recently underwent an MRI of the lumbar spine and cervical spine without evidence of metastasis. Some foraminal stenosis and degenerative changes were noted.  The patient continues to have pain in both breasts more so along the right side. She denies any nipple discharge or bleeding. She is scheduled for imaging in the spring. She continues to followup with medical oncology on a yearly basis.                             ALLERGIES:  is allergic to entex.  Meds: Current Outpatient Prescriptions  Medication Sig Dispense Refill  . ALPRAZolam (XANAX) 0.25 MG tablet TAKE 1 TABLET BY MOUTH EVERY 6 HOURS AS NEEDED FOR ANXIETY.  Called in to [facility] on [address] per Dr. Sondra Come.    . benzonatate (TESSALON) 200 MG capsule Take 200 mg by mouth 3 (three) times daily as needed.    . calcium citrate-vitamin D 200-200 MG-UNIT TABS Take 2 tablets by mouth daily.    . cholecalciferol (VITAMIN D) 1000 UNITS tablet Take 2,000 Units by mouth daily.    . cyclobenzaprine (FLEXERIL) 10 MG tablet Take 10 mg by mouth daily as needed.     . Golimumab (SIMPONI) 50 MG/0.5ML SOLN Inject 50 mg into the skin every 30 (thirty) days.    Marland Kitchen HYDROcodone-acetaminophen (NORCO/VICODIN) 5-325 MG per tablet Take 2 tablets by mouth every 6 (six) hours as needed. 360 tablet 0  . hyoscyamine  (ANASPAZ) 0.125 MG TBDP disintergrating tablet Place 0.125 mg under the tongue every 6 (six) hours as needed.    Marland Kitchen ipratropium (ATROVENT) 0.03 % nasal spray     . levothyroxine (SYNTHROID, LEVOTHROID) 75 MCG tablet Take 75 mcg by mouth daily before breakfast.    . lidocaine (XYLOCAINE) 5 % ointment   3  . montelukast (SINGULAIR) 10 MG tablet     . Multiple Vitamin (MULTIVITAMIN) tablet Take 1 tablet by mouth daily.    . clotrimazole-betamethasone (LOTRISONE) cream Apply 1 application topically as needed.     . diazepam (VALIUM) 5 MG tablet Take 5 mg by mouth every 6 (six) hours as needed for anxiety. Only uses for MRI    . LIDODERM 5 % Place onto the skin as needed.    . nortriptyline (PAMELOR) 25 MG capsule      No current facility-administered medications for this encounter.    Physical Findings: The patient is in no acute distress. Patient is alert and oriented.  height is 5\' 7"  (1.702 m) and weight is 114 lb 14.4 oz (52.118 kg). Her oral temperature is 98.1 F (36.7 C). Her blood pressure is 109/76 and her pulse is 91. Her respiration is 16. Marland Kitchen No palpable cervical supraclavicular or axillary adenopathy. Lungs are clear  to auscultation. The heart has a regular rhythm and rate. examination of the right breast reveals no palpable mass or discharge. Examination left breast reveals no palpable mass or nipple discharge. Patient has some induration consistent with scar tissue superior to her lumpectomy scar. Patient is tender with palpation in this region.  Lab Findings: Lab Results  Component Value Date   WBC 4.6 08/23/2014   HGB 13.2 08/23/2014   HCT 39.5 08/23/2014   MCV 94.3 08/23/2014   PLT 194 08/23/2014    Radiographic Findings: No results found.  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  No evidence for recurrence on clinical exam today.  The patient continues to be anxious about her overall diagnosis of breast cancer and  concern for recurrence. She continues to have chronic pain in the left breast and will continue on hydrocodone for this issue. She also as above has chronic discomfort in the right breast.  Plan:  Routine followup in 6 months.  ____________________________________ Blair Promise, MD

## 2014-10-25 NOTE — Progress Notes (Signed)
Carolyn Garcia here for follow up after treatment for left breast cancer.  She is reporting pain in her chest and under her left arm that she is rating at a 6/10.  She reports that she is hurting every day and "is frustrated because she can't get relief."  She is taking norco 3-4 tablets daily.  She is also using lidocaine ointment on her back.  She has lost 16 lbs since May.  She reports that she does not feel like eating.  She reports fatigue.  The skin on her left breast and underarm is intact.  She also has a spot on her posterior right shoulder that she is concerned about.

## 2015-01-18 ENCOUNTER — Telehealth: Payer: Self-pay | Admitting: Oncology

## 2015-01-18 ENCOUNTER — Other Ambulatory Visit: Payer: Self-pay | Admitting: Radiation Oncology

## 2015-01-18 MED ORDER — HYDROCODONE-ACETAMINOPHEN 5-325 MG PO TABS
2.0000 | ORAL_TABLET | Freq: Four times a day (QID) | ORAL | Status: DC | PRN
Start: 2015-01-18 — End: 2015-04-21

## 2015-01-18 NOTE — Telephone Encounter (Signed)
Carolyn Garcia called requesting a refill on her pain medication - HYDROcodone-acetaminophen (NORCO/VICODIN) 5-325 MG tablets.  She last had them filled on 10/25/14.

## 2015-02-22 ENCOUNTER — Other Ambulatory Visit: Payer: Self-pay | Admitting: Allergy and Immunology

## 2015-02-22 DIAGNOSIS — J329 Chronic sinusitis, unspecified: Secondary | ICD-10-CM

## 2015-03-02 ENCOUNTER — Ambulatory Visit
Admission: RE | Admit: 2015-03-02 | Discharge: 2015-03-02 | Disposition: A | Discharge status: Home / Self Care | Payer: Medicare Other | Source: Ambulatory Visit | Attending: Allergy and Immunology | Admitting: Allergy and Immunology

## 2015-03-02 DIAGNOSIS — J329 Chronic sinusitis, unspecified: Secondary | ICD-10-CM

## 2015-03-28 ENCOUNTER — Ambulatory Visit: Payer: Medicare Other

## 2015-03-30 ENCOUNTER — Ambulatory Visit
Admission: RE | Admit: 2015-03-30 | Discharge: 2015-03-30 | Disposition: A | Discharge status: Home / Self Care | Payer: Medicare Other | Source: Ambulatory Visit | Attending: Hematology and Oncology | Admitting: Hematology and Oncology

## 2015-03-30 DIAGNOSIS — Z1231 Encounter for screening mammogram for malignant neoplasm of breast: Secondary | ICD-10-CM

## 2015-03-31 ENCOUNTER — Other Ambulatory Visit: Payer: Self-pay | Admitting: *Deleted

## 2015-03-31 ENCOUNTER — Telehealth: Payer: Self-pay | Admitting: Oncology

## 2015-03-31 ENCOUNTER — Telehealth: Payer: Self-pay | Admitting: Hematology and Oncology

## 2015-03-31 DIAGNOSIS — C50919 Malignant neoplasm of unspecified site of unspecified female breast: Secondary | ICD-10-CM

## 2015-03-31 NOTE — Telephone Encounter (Signed)
Carolyn Garcia called and said she was not able to have her mammogram yesterday at the [place].  Before the mammogram she was asked if she had any pain.  She pointed to her right breast and mentioned that the pain was inside the breast. The mammogram technician said that this was focal pain and that the mammogram could not be done.  She would need orders for a diagnostic mammogram with an ultrasound/possible biopsy.  She said that Dr. Lindi Adie has originally ordered the mammogram and she is scheduled to see him on 04/04/15.  Left a message for Dr. Geralyn Flash nurse per Janne's request to see if new orders could be placed.

## 2015-03-31 NOTE — Telephone Encounter (Signed)
Left a message requesting a return call.

## 2015-03-31 NOTE — Telephone Encounter (Signed)
Called Sahalie back and let her know that Diane, Rn, Dr. Geralyn Flash nurse, will place the order for the diagnostic mammogram and will set up another follow up appointment with Dr. Lindi Adie to review the results.  Carolyn Garcia verbalized understanding.

## 2015-03-31 NOTE — Telephone Encounter (Signed)
Patient called in to cancel her appointment and will  Call back to reschedule  anne

## 2015-04-01 ENCOUNTER — Telehealth: Payer: Self-pay | Admitting: Hematology and Oncology

## 2015-04-01 ENCOUNTER — Other Ambulatory Visit: Payer: Self-pay | Admitting: Hematology and Oncology

## 2015-04-01 DIAGNOSIS — N644 Mastodynia: Secondary | ICD-10-CM

## 2015-04-01 NOTE — Telephone Encounter (Signed)
Patient had called in to reschedule her appointments and i have left a message with a new appointment date and time

## 2015-04-01 NOTE — Telephone Encounter (Signed)
Appointments rescheduled per patient request due to work schedule

## 2015-04-04 ENCOUNTER — Other Ambulatory Visit: Payer: Medicare Other

## 2015-04-04 ENCOUNTER — Ambulatory Visit: Payer: Medicare Other | Admitting: Hematology and Oncology

## 2015-04-13 ENCOUNTER — Other Ambulatory Visit (HOSPITAL_BASED_OUTPATIENT_CLINIC_OR_DEPARTMENT_OTHER): Payer: Medicare Other

## 2015-04-13 DIAGNOSIS — Z853 Personal history of malignant neoplasm of breast: Secondary | ICD-10-CM

## 2015-04-13 LAB — CBC WITH DIFFERENTIAL/PLATELET
BASO%: 0.6 % (ref 0.0–2.0)
Basophils Absolute: 0 10*3/uL (ref 0.0–0.1)
EOS ABS: 0.1 10*3/uL (ref 0.0–0.5)
EOS%: 1.3 % (ref 0.0–7.0)
HCT: 38.4 % (ref 34.8–46.6)
HGB: 12.6 g/dL (ref 11.6–15.9)
LYMPH%: 47.4 % (ref 14.0–49.7)
MCH: 30.5 pg (ref 25.1–34.0)
MCHC: 32.8 g/dL (ref 31.5–36.0)
MCV: 93.1 fL (ref 79.5–101.0)
MONO#: 0.3 10*3/uL (ref 0.1–0.9)
MONO%: 7.3 % (ref 0.0–14.0)
NEUT%: 43.4 % (ref 38.4–76.8)
NEUTROS ABS: 1.8 10*3/uL (ref 1.5–6.5)
PLATELETS: 220 10*3/uL (ref 145–400)
RBC: 4.12 10*6/uL (ref 3.70–5.45)
RDW: 11.8 % (ref 11.2–14.5)
WBC: 4.2 10*3/uL (ref 3.9–10.3)
lymph#: 2 10*3/uL (ref 0.9–3.3)

## 2015-04-13 LAB — COMPREHENSIVE METABOLIC PANEL (CC13)
ALK PHOS: 80 U/L (ref 40–150)
ALT: 16 U/L (ref 0–55)
ANION GAP: 12 meq/L — AB (ref 3–11)
AST: 19 U/L (ref 5–34)
Albumin: 3.9 g/dL (ref 3.5–5.0)
BUN: 6.6 mg/dL — ABNORMAL LOW (ref 7.0–26.0)
CO2: 25 meq/L (ref 22–29)
Calcium: 9.5 mg/dL (ref 8.4–10.4)
Chloride: 105 mEq/L (ref 98–109)
Creatinine: 0.7 mg/dL (ref 0.6–1.1)
EGFR: >90 mL/min/{1.73_m2} (ref >90)
Glucose: 82 mg/dl (ref 70–140)
Potassium: 3.7 mEq/L (ref 3.5–5.1)
Sodium: 142 mEq/L (ref 136–145)
Total Bilirubin: 0.28 mg/dL (ref 0.20–1.20)
Total Protein: 7.2 g/dL (ref 6.4–8.3)

## 2015-04-19 ENCOUNTER — Telehealth: Payer: Self-pay | Admitting: Oncology

## 2015-04-19 ENCOUNTER — Ambulatory Visit
Admission: RE | Admit: 2015-04-19 | Discharge: 2015-04-19 | Disposition: A | Discharge status: Home / Self Care | Payer: Medicare Other | Source: Ambulatory Visit | Attending: Hematology and Oncology | Admitting: Hematology and Oncology

## 2015-04-19 DIAGNOSIS — N644 Mastodynia: Secondary | ICD-10-CM

## 2015-04-19 NOTE — Telephone Encounter (Signed)
Abbi left a message requesting a refill on Hydrocodone-acetaminophen (NORCO/VICODIN) 5-325 MG per tablet.  She last had it filled on 01/18/15.  She said she will not be answering her phone today because she is going to have her mammogram done.

## 2015-04-20 ENCOUNTER — Other Ambulatory Visit: Payer: Medicare Other

## 2015-04-21 ENCOUNTER — Other Ambulatory Visit: Payer: Self-pay | Admitting: Radiation Oncology

## 2015-04-21 ENCOUNTER — Telehealth: Payer: Self-pay | Admitting: Oncology

## 2015-04-21 MED ORDER — HYDROCODONE-ACETAMINOPHEN 5-325 MG PO TABS: 2.0000 | ORAL_TABLET | Freq: Four times a day (QID) | ORAL | Status: DC | PRN

## 2015-04-21 NOTE — Telephone Encounter (Signed)
Called Carolyn Garcia and let her know that the prescription for hydrocodone/acetaminophen is available for pick up in the nursing area.  [name] verbalized agreement and said she is having a lot of pain in her neck and breast from the mammogram.  She will pick up the prescription tomorrow.

## 2015-04-28 ENCOUNTER — Ambulatory Visit: Payer: Medicare Other | Admitting: Hematology and Oncology

## 2015-04-28 ENCOUNTER — Telehealth: Payer: Self-pay | Admitting: Hematology and Oncology

## 2015-04-28 ENCOUNTER — Other Ambulatory Visit: Payer: Medicare Other

## 2015-04-28 NOTE — Assessment & Plan Note (Signed)
Left breast lumpectomy, 2.5 cm, 2 lymph nodes positive, triple negative, poorly differentiated, grade 3, T2 N1 M0 stage IIB status post 6 cycles of TAC followed by radiation. Currently on surveillance  Breast Cancer Surveillance: 1. Breast exam 04/28/2015: Normal 2. Mammogram 04/19/2015 No abnormalities. Postsurgical changes. Breast Density Category B. I recommended that she get 3-D mammograms for surveillance. Discussed the differences between different breast density categories.   Return to clinic once a year to the survivorship clinic.

## 2015-04-28 NOTE — Telephone Encounter (Signed)
Patient called in to cancel todays appointment due to stomach issue and will call back to reschedule  Carolyn Garcia

## 2015-05-05 ENCOUNTER — Ambulatory Visit: Payer: Medicare Other | Admitting: Radiation Oncology

## 2015-05-18 ENCOUNTER — Ambulatory Visit
Admission: RE | Admit: 2015-05-18 | Discharge: 2015-05-18 | Disposition: A | Discharge status: Home / Self Care | Payer: Medicare Other | Source: Ambulatory Visit | Attending: Radiation Oncology | Admitting: Radiation Oncology

## 2015-05-18 ENCOUNTER — Encounter: Payer: Self-pay | Admitting: Radiation Oncology

## 2015-05-18 VITALS — BP 128/78 | HR 84 | Temp 97.9°F | Resp 16 | Ht 67.0 in | Wt 122.1 lb

## 2015-05-18 DIAGNOSIS — C50912 Malignant neoplasm of unspecified site of left female breast: Secondary | ICD-10-CM

## 2015-05-18 NOTE — Progress Notes (Signed)
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  She continues to be anxious and under a lot of stress related to her family situation. She continues to have  pain in the neck and lower back area. She recently underwent an MRI of the lumbar spine and cervical spine without evidence of metastasis. Some foraminal stenosis and degenerative changes were noted.  The patient continues to have pain in both breasts more so along the left side.  She reports pain along the nipple area which is different from her previous pain near the lumpectomy site. Patient did undergo mammography recently with no suspicious areas noted in either breast. Lumpectomy changes were noted in the left breast. She denies any nipple discharge or bleeding.  She continues to followup with medical oncology on a yearly basis.  She is saddened since her internist is leaving town and she is unsure which internist to pursue for follow-up.                         ALLERGIES:  is allergic to entex.  Meds: Current Outpatient Prescriptions  Medication Sig Dispense Refill  . ALPRAZolam (XANAX) 0.25 MG tablet TAKE 1 TABLET BY MOUTH EVERY 6 HOURS AS NEEDED FOR ANXIETY.  Called in to [facility] on [address] per Dr. Sondra Come.    . benzonatate (TESSALON) 200 MG capsule Take 200 mg by mouth 3 (three) times daily as needed.    . calcium citrate-vitamin D 200-200 MG-UNIT TABS Take 2 tablets by mouth daily.    . cholecalciferol (VITAMIN D) 1000 UNITS tablet Take 2,000 Units by mouth daily.    . clotrimazole-betamethasone (LOTRISONE) cream Apply 1 application topically as needed.     .  cyclobenzaprine (FLEXERIL) 10 MG tablet Take 10 mg by mouth daily as needed.     . diazepam (VALIUM) 5 MG tablet Take 5 mg by mouth every 6 (six) hours as needed for anxiety. Only uses for MRI    . Golimumab (SIMPONI) 50 MG/0.5ML SOLN Inject 50 mg into the skin every 30 (thirty) days.    Marland Kitchen HYDROcodone-acetaminophen (NORCO/VICODIN) 5-325 MG per tablet Take 2 tablets by mouth every 6 (six) hours as needed. 360 tablet 0  . hyoscyamine (ANASPAZ) 0.125 MG TBDP disintergrating tablet Place 0.125 mg under the tongue every 6 (six) hours as needed.    Marland Kitchen ipratropium (ATROVENT) 0.03 % nasal spray     . levothyroxine (SYNTHROID, LEVOTHROID) 75 MCG tablet Take 75 mcg by mouth daily before breakfast.    . lidocaine (XYLOCAINE) 5 % ointment   3  . LIDODERM 5 % Place onto the skin as needed.    . montelukast (SINGULAIR) 10 MG tablet     . Multiple Vitamin (MULTIVITAMIN) tablet Take 1 tablet by mouth daily.    . nortriptyline (PAMELOR) 25 MG capsule      No current facility-administered medications for this encounter.    Physical Findings: The patient is in no acute distress. Patient is alert and oriented.  vitals were not taken for this visit.Marland Kitchen No palpable cervical supraclavicular or axillary adenopathy. Lungs are clear to auscultation. The  heart has a regular rhythm and rate. examination of the right breast reveals no palpable mass or discharge. Examination left breast reveals no palpable mass or nipple discharge. Patient has some induration consistent with scar tissue superior to her lumpectomy scar. Patient is tender with palpation much of both breasts. No nipple discharge or bleeding  Lab Findings: Lab Results  Component Value Date   WBC 4.2 04/13/2015   HGB 12.6 04/13/2015   HCT 38.4 04/13/2015   MCV 93.1 04/13/2015   PLT 220 04/13/2015    Radiographic Findings: Mm Diag Breast Tomo Bilateral  04/19/2015   CLINICAL DATA:  Patient with focal right breast pain in the upper outer quadrant. No  reported mass. Personal history of left breast carcinoma treated with lumpectomy in December, 2003  EXAM: DIGITAL DIAGNOSTIC BILATERAL MAMMOGRAM WITH 3D TOMOSYNTHESIS AND CAD  COMPARISON:  Prior exam  ACR Breast Density Category b: There are scattered areas of fibroglandular density.  FINDINGS: There is focal opacity and architectural distortion in the upper outer left breast reflecting postsurgical scarring, stable for multiple years.  There are no discrete masses or other areas of architectural distortion. No right breast masses or areas of asymmetry are seen. There are no suspicious calcifications. No mammographic change.  Mammographic images were processed with CAD.  IMPRESSION: No evidence of recurrent or new breast malignancy. Benign postsurgical changes on the left. No abnormality in the right breast to correspond to the area of focal pain.  RECOMMENDATION: Screening mammogram in one year.(Code:[id number]).  Patient was instructed that if her pain persists, that repeat imaging including possible ultrasound and/or breast MRI would be indicated.  I have discussed the findings and recommendations with the patient. Results were also provided in writing at the conclusion of the visit. If applicable, a reminder letter will be sent to the patient regarding the next appointment.  BI-RADS CATEGORY  2: Benign.   Electronically Signed   By: Lajean Manes M.D.   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  No evidence for recurrence on clinical exam today.  The patient continues to be anxious about her overall diagnosis of breast cancer and concern for recurrence. She continues to have chronic pain in the left breast and will continue on hydrocodone for this issue. She also as above has chronic discomfort in the right breast. Radiology did mention to the patient consideration for an MRI she has persistent pain in the breast area. The patient will  think this issue over in call back if she wishes to proceed with MRI of the breast area.  Plan:  Routine followup in 6 months.  ____________________________________ Blair Promise, PhD, MD

## 2015-05-18 NOTE — Progress Notes (Signed)
Carolyn Garcia her for follow up.  She reports pain in her right and left breasts at a 6/10.  She said the pain in her left breast is mostly around the nipple area.  She is taking 3-4 Vicodin per day.  She reports a poor energy level.  She also reports having trouble breathing and is wondering if it is from anxiety.  She reports the skin on her left breast is "tight."  She says it hurts to raise her left arm.  BP 128/78 mmHg  Pulse 84  Temp(Src) 97.9 F (36.6 C) (Oral)  Resp 16  Ht 5\' 7"  (1.702 m)  Wt 122 lb 1.6 oz (55.384 kg)  BMI 19.12 kg/m2  SpO2 100%

## 2015-08-02 ENCOUNTER — Telehealth: Payer: Self-pay | Admitting: Oncology

## 2015-08-02 NOTE — Telephone Encounter (Signed)
Carolyn Garcia called and requested a refill on HYDROcodone-acetaminophen (NORCO/VICODIN) 5-325 MG per tablets.  She last had them filled on 04/21/15.

## 2015-08-03 ENCOUNTER — Telehealth: Payer: Self-pay | Admitting: Oncology

## 2015-08-03 ENCOUNTER — Other Ambulatory Visit: Payer: Self-pay | Admitting: Radiation Oncology

## 2015-08-03 MED ORDER — HYDROCODONE-ACETAMINOPHEN 5-325 MG PO TABS: 2.0000 | ORAL_TABLET | Freq: Four times a day (QID) | ORAL | Status: DC | PRN

## 2015-08-03 NOTE — Telephone Encounter (Addendum)
Left a message for Carolyn Garcia advising her that her pain medication script is available for pickup in the Radiation Nursing station.

## 2015-11-10 ENCOUNTER — Encounter: Payer: Self-pay | Admitting: Radiation Oncology

## 2015-11-10 ENCOUNTER — Ambulatory Visit
Admission: RE | Admit: 2015-11-10 | Discharge: 2015-11-10 | Disposition: A | Discharge status: Home / Self Care | Payer: Medicare Other | Source: Ambulatory Visit | Attending: Radiation Oncology | Admitting: Radiation Oncology

## 2015-11-10 VITALS — BP 115/80 | HR 78 | Temp 97.9°F | Ht 67.0 in | Wt 127.3 lb

## 2015-11-10 DIAGNOSIS — C50412 Malignant neoplasm of upper-outer quadrant of left female breast: Secondary | ICD-10-CM

## 2015-11-10 MED ORDER — HYDROCODONE-ACETAMINOPHEN 5-325 MG PO TABS: 2.0000 | ORAL_TABLET | Freq: Four times a day (QID) | ORAL | Status: DC | PRN

## 2015-11-10 NOTE — Progress Notes (Signed)
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 03/29/2003-05/12/2003. Adjuvant radiation therapy to the left breast.  Narrative:  The patient returns today for routine follow-up. She continues to complain of chronic pain in both breast area more so along the left side where she received radiation therapy. She also complains of pain in her upper neck region. She is seeing other physicians concerning her neck pain. No surgery is planned at this time. She denies any nipple discharge or bleeding.                         ALLERGIES:  is allergic to entex.  Meds: Current Outpatient Prescriptions  Medication Sig Dispense Refill  . ALPRAZolam (XANAX) 0.25 MG tablet TAKE 1 TABLET BY MOUTH EVERY 6 HOURS AS NEEDED FOR ANXIETY.  Called in to [facility] on [address] per Dr. Sondra Come.    . benzonatate (TESSALON) 200 MG capsule Take 200 mg by mouth 3 (three) times daily as needed.    . cholecalciferol (VITAMIN D) 1000 UNITS tablet Take 2,000 Units by mouth daily.    . cyclobenzaprine (FLEXERIL) 10 MG tablet Take 10 mg by mouth daily as needed.     . Golimumab (SIMPONI) 50 MG/0.5ML SOLN Inject 50 mg into the skin every 30 (thirty) days.    Marland Kitchen HYDROcodone-acetaminophen (NORCO/VICODIN) 5-325 MG tablet Take 2 tablets by mouth every 6 (six) hours as needed. 360 tablet 0  . hyoscyamine (ANASPAZ) 0.125 MG TBDP disintergrating tablet Place 0.125 mg under the tongue every 6 (six) hours as needed.    Marland Kitchen levocetirizine (XYZAL) 5 MG tablet   5  . levothyroxine (SYNTHROID, LEVOTHROID) 75 MCG tablet Take 75 mcg by mouth daily before breakfast.    . lidocaine (XYLOCAINE) 5 % ointment   3  . LIDODERM 5 % Place onto  the skin as needed.    . montelukast (SINGULAIR) 10 MG tablet     . Multiple Vitamin (MULTIVITAMIN) tablet Take 1 tablet by mouth daily.    . calcium citrate-vitamin D 200-200 MG-UNIT TABS Take 2 tablets by mouth daily.    . clotrimazole-betamethasone (LOTRISONE) cream Apply 1 application topically as needed.     . diclofenac sodium (VOLTAREN) 1 % GEL APP EXT AA Q 6 H UTD PRN  2  . ipratropium (ATROVENT) 0.03 % nasal spray      No current facility-administered medications for this encounter.    Physical Findings: The patient is in no acute distress. Patient is alert and oriented.  height is 5\' 7"  (1.702 m) and weight is 127 lb 4.8 oz (57.743 kg). Her oral temperature is 97.9 F (36.6 C). Her blood pressure is 115/80 and her pulse is 78. Marland Kitchen No palpable cervical supraclavicular or axillary adenopathy. Lungs are clear to auscultation. The heart has a regular rhythm and rate. examination of the right breast reveals no palpable mass or discharge. Examination left breast reveals no palpable mass or nipple discharge. Patient has some induration consistent with scar tissue superior to her lumpectomy scar. Patient is mildly tender with palpation much of both breasts. No nipple discharge or bleeding. Patient noticed a sore area in the upper-inner aspect  of her right breast. Careful palpation this area appears to be a small cyst.  Lab Findings: Lab Results  Component Value Date   WBC 4.2 04/13/2015   HGB 12.6 04/13/2015   HCT 38.4 04/13/2015   MCV 93.1 04/13/2015   PLT 220 04/13/2015    Radiographic Findings: No results found.  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  No evidence for recurrence on clinical exam today.  The patient continues to be anxious about her overall diagnosis of breast cancer and concern for recurrence. She continues to have chronic pain in the left breast and will continue on hydrocodone for this issue. She also as above  has chronic discomfort in the right breast. Radiology did mention to the patient consideration for an MRI she has persistent pain in the breast area. The patient will think this issue over in call back if she wishes to proceed with MRI of the breast area. She is up-to-date on her mammograms.  Plan:  Routine followup in 6 months. I refilled her hydrocodone today.  ____________________________________ Blair Promise, PhD, MD  This document serves as a record of services personally performed by Gery Pray, MD. It was created on his behalf by Darcus Austin, a trained medical scribe. The creation of this record is based on the scribe's personal observations and the provider's statements to them. This document has been checked and approved by the attending provider.

## 2015-11-10 NOTE — Progress Notes (Signed)
  Carolyn Garcia here for follow up.  She reports pain in her upper chest, bil breasts and left underarm .  She is taking 3-4 Vicodin per day and needs a refill.  She reports having fatigue and a poor appetite.  She reports her skin is intact on her left breast.    BP 115/80 mmHg  Pulse 78  Temp(Src) 97.9 F (36.6 C) (Oral)  Ht 5\' 7"  (1.702 m)  Wt 127 lb 4.8 oz (57.743 kg)  BMI 19.93 kg/m2

## 2016-02-02 ENCOUNTER — Other Ambulatory Visit: Payer: Self-pay | Admitting: Radiation Oncology

## 2016-02-02 ENCOUNTER — Telehealth: Payer: Self-pay | Admitting: Oncology

## 2016-02-02 MED ORDER — HYDROCODONE-ACETAMINOPHEN 5-325 MG PO TABS: 2.0000 | ORAL_TABLET | Freq: Four times a day (QID) | ORAL | Status: DC | PRN

## 2016-02-02 NOTE — Telephone Encounter (Signed)
Carolyn Garcia called and requested a refill on HYDROcodone-acetaminophen (NORCO/VICODIN) 5-325 MG tablet.  She last had it filled on 11/10/15.  She said she had about 20 tablets left and is taking 3-4 tablets per day.

## 2016-02-03 ENCOUNTER — Telehealth: Payer: Self-pay | Admitting: Oncology

## 2016-02-03 NOTE — Telephone Encounter (Signed)
Left a message for Carolyn Garcia notifying her that the pain medication refill is available for pick up in the Radiation Nursing area.

## 2016-04-25 ENCOUNTER — Encounter: Payer: Self-pay | Admitting: Radiation Oncology

## 2016-04-25 ENCOUNTER — Ambulatory Visit
Admission: RE | Admit: 2016-04-25 | Discharge: 2016-04-25 | Disposition: A | Discharge status: Home / Self Care | Payer: Medicare Other | Source: Ambulatory Visit | Attending: Radiation Oncology | Admitting: Radiation Oncology

## 2016-04-25 VITALS — BP 105/78 | HR 96 | Temp 98.1°F | Ht 67.0 in | Wt 129.0 lb

## 2016-04-25 DIAGNOSIS — C50412 Malignant neoplasm of upper-outer quadrant of left female breast: Secondary | ICD-10-CM

## 2016-04-25 MED ORDER — HYDROCODONE-ACETAMINOPHEN 5-325 MG PO TABS: 2.0000 | ORAL_TABLET | Freq: Four times a day (QID) | ORAL | Status: DC | PRN

## 2016-04-25 NOTE — Progress Notes (Signed)
Carolyn Garcia here for follow up.  She reports having pain in both breasts.  She said she started having occasional pulsing pain in her right breast that started 2 weeks ago.  She has been taking 4 tablets of norco per day and would like a refill.  She is also due for a mammogram and is not sure how to handle it due to the pain.  She is also wondering if she needs to have labs done.  She reports having fatigue.  BP 105/78 mmHg  Pulse 96  Temp(Src) 98.1 F (36.7 C) (Oral)  Ht 5\' 7"  (1.702 m)  Wt 129 lb (58.514 kg)  BMI 20.20 kg/m2   Wt Readings from Last 3 Encounters:  04/25/16 129 lb (58.514 kg)  11/10/15 127 lb 4.8 oz (57.743 kg)  05/18/15 122 lb 1.6 oz (55.384 kg)

## 2016-04-25 NOTE — Progress Notes (Signed)
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  Narrative:  The patient returns today for routine follow-up. She continues to complain of chronic pain in both breasts. She said she started having occasional pulsing pain in her right breast that started 2 weeks ago. Her rheumatoid arthritis has been giving her problems as well.  She has been taking 4 tablets of norco per day and would like a refill. She is also due for a mammogram and is not sure how to handle it due to the pain. She is also wondering if she needs to have labs done. She reports having fatigue and attributes this to the pain. She denies nipple discharge/bleeding and denies upper extremity swelling. She continues to have chronic neck pain.  ALLERGIES:  is allergic to entex.  Meds: Current Outpatient Prescriptions  Medication Sig Dispense Refill  . ALPRAZolam (XANAX) 0.25 MG tablet TAKE 1 TABLET BY MOUTH EVERY 6 HOURS AS NEEDED FOR ANXIETY.  Called in to [facility] on [address] per Dr. Sondra Come.    . benzonatate (TESSALON) 200 MG capsule Take 200 mg by mouth 3 (three) times daily as needed.    . cholecalciferol (VITAMIN D) 1000 UNITS tablet Take 2,000 Units by mouth daily.    . cyclobenzaprine (FLEXERIL) 10 MG tablet Take 10 mg by mouth daily as needed.     . diclofenac sodium (VOLTAREN) 1 % GEL APP EXT AA Q 6 H UTD PRN  2  . Golimumab (SIMPONI) 50 MG/0.5ML SOLN Inject 50 mg into the skin every 30 (thirty) days.    Marland Kitchen HYDROcodone-acetaminophen (NORCO/VICODIN) 5-325 MG tablet Take 2 tablets by mouth every 6 (six) hours as needed. 360 tablet 0  . hyoscyamine  (ANASPAZ) 0.125 MG TBDP disintergrating tablet Place 0.125 mg under the tongue every 6 (six) hours as needed.    Marland Kitchen levocetirizine (XYZAL) 5 MG tablet   5  . levothyroxine (SYNTHROID, LEVOTHROID) 75 MCG tablet Take 75 mcg by mouth daily before breakfast.    . LIDODERM 5 % Place onto the skin as needed.    . montelukast (SINGULAIR) 10 MG tablet     . Multiple Vitamin (MULTIVITAMIN) tablet Take 1 tablet by mouth daily.    . calcium citrate-vitamin D 200-200 MG-UNIT TABS Take 2 tablets by mouth daily. Reported on 04/25/2016    . clotrimazole-betamethasone (LOTRISONE) cream Apply 1 application topically as needed. Reported on 04/25/2016    . ipratropium (ATROVENT) 0.03 % nasal spray Reported on 04/25/2016    . lidocaine (XYLOCAINE) 5 % ointment Reported on 04/25/2016  3  . nortriptyline (PAMELOR) 25 MG capsule Reported on 04/25/2016  3   No current facility-administered medications for this encounter.    Physical Findings: The patient is in no acute distress. Patient is alert and oriented.  height is 5\' 7"  (1.702 m) and weight is 129 lb (58.514 kg). Her oral temperature is 98.1 F (36.7 C). Her blood pressure is 105/78 and her pulse is 96. Marland Kitchen No palpable cervical supraclavicular or axillary adenopathy. Lungs are clear to auscultation. The heart has a regular rhythm and rate.  Examination of the right breast reveals no palpable mass or  discharge. Patient noticed a sore area in the upper-inner aspect of her right breast. Careful palpation of this area appears to be a possible small cyst. Examination of the left breast reveals no palpable mass or nipple discharge. Patient has some induration consistent with scar tissue superior to her lumpectomy scar. Patient is mildly tender with palpation much of both breasts. No nipple discharge or bleeding.  Lab Findings: Lab Results  Component Value Date   WBC 4.2 04/13/2015   HGB 12.6 04/13/2015   HCT 38.4 04/13/2015   MCV 93.1 04/13/2015   PLT 220 04/13/2015     Radiographic Findings: No results found.  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  No evidence for recurrence on clinical exam today.  The patient continues to be anxious about her overall diagnosis of breast cancer and concern for recurrence. She continues to have chronic pain in the left breast and will continue on hydrocodone for this issue.. I will set the patient up for bilateral diagnostic mammograms in light of her persistent pain of the breasts and questionable finding in the right breast. She will get labs through her rheumatologist.  Plan:  Routine followup in 6 months. I refilled her hydrocodone today. She takes approximately 4 of these tablets per day.  ____________________________________ Blair Promise, PhD, MD  This document serves as a record of services personally performed by Gery Pray, MD. It was created on his behalf by Darcus Austin, a trained medical scribe. The creation of this record is based on the scribe's personal observations and the provider's statements to them. This document has been checked and approved by the attending provider.

## 2016-05-08 ENCOUNTER — Other Ambulatory Visit: Payer: Self-pay | Admitting: Radiation Oncology

## 2016-05-08 DIAGNOSIS — N644 Mastodynia: Secondary | ICD-10-CM

## 2016-05-10 ENCOUNTER — Ambulatory Visit: Payer: Medicare Other | Admitting: Radiation Oncology

## 2016-05-23 ENCOUNTER — Other Ambulatory Visit: Payer: Medicare Other

## 2016-07-12 ENCOUNTER — Ambulatory Visit
Admission: RE | Admit: 2016-07-12 | Discharge: 2016-07-12 | Disposition: A | Discharge status: Home / Self Care | Payer: Medicare Other | Source: Ambulatory Visit | Attending: Radiation Oncology | Admitting: Radiation Oncology

## 2016-07-12 DIAGNOSIS — N644 Mastodynia: Secondary | ICD-10-CM

## 2016-07-16 ENCOUNTER — Telehealth: Payer: Self-pay | Admitting: Oncology

## 2016-07-16 NOTE — Telephone Encounter (Signed)
Carolyn Garcia called and asked for refill on her pain medication - HYDROcodone-acetaminophen (NORCO/VICODIN) 5-325 MG.  She last had it filled on 04/25/2016.  She also wanted to make sure that Dr. Sondra Come received her mammogram results from 07/12/16.  Advised her that the results are in EPIC and that we will call her when the refill is received.

## 2016-07-17 ENCOUNTER — Other Ambulatory Visit: Payer: Self-pay | Admitting: Radiation Oncology

## 2016-07-17 MED ORDER — HYDROCODONE-ACETAMINOPHEN 5-325 MG PO TABS: 2.0000 | ORAL_TABLET | Freq: Four times a day (QID) | ORAL | 0 refills | Status: DC | PRN

## 2016-07-18 NOTE — Telephone Encounter (Signed)
Left a message for Carolyn Garcia advising her that her pain medication prescription is ready for pick up in the Radiation Nursing area.

## 2016-10-09 ENCOUNTER — Telehealth: Payer: Self-pay | Admitting: Oncology

## 2016-10-09 ENCOUNTER — Ambulatory Visit: Payer: Self-pay | Admitting: Radiation Oncology

## 2016-10-09 NOTE — Telephone Encounter (Signed)
Carolyn Garcia left a message saying that she is trying to move her appointment up from November 16 with Dr. Sondra Come but was told he was booked.  She said she will run out of pain medication by that time.  She is wondering if she can get a refill or move her appointment up.

## 2016-10-17 ENCOUNTER — Encounter: Payer: Self-pay | Admitting: Radiation Oncology

## 2016-10-17 ENCOUNTER — Ambulatory Visit
Admission: RE | Admit: 2016-10-17 | Discharge: 2016-10-17 | Disposition: A | Discharge status: Home / Self Care | Payer: Medicare Other | Source: Ambulatory Visit | Attending: Radiation Oncology | Admitting: Radiation Oncology

## 2016-10-17 VITALS — BP 108/86 | HR 88 | Temp 98.4°F | Ht 67.0 in | Wt 128.8 lb

## 2016-10-17 DIAGNOSIS — G8929 Other chronic pain: Secondary | ICD-10-CM | POA: Insufficient documentation

## 2016-10-17 DIAGNOSIS — C50912 Malignant neoplasm of unspecified site of left female breast: Secondary | ICD-10-CM | POA: Diagnosis not present

## 2016-10-17 DIAGNOSIS — N644 Mastodynia: Secondary | ICD-10-CM | POA: Insufficient documentation

## 2016-10-17 DIAGNOSIS — C50412 Malignant neoplasm of upper-outer quadrant of left female breast: Secondary | ICD-10-CM

## 2016-10-17 DIAGNOSIS — Z79899 Other long term (current) drug therapy: Secondary | ICD-10-CM | POA: Insufficient documentation

## 2016-10-17 MED ORDER — HYDROCODONE-ACETAMINOPHEN 5-325 MG PO TABS: 2.0000 | ORAL_TABLET | Freq: Four times a day (QID) | ORAL | 0 refills | Status: DC | PRN

## 2016-10-17 NOTE — Progress Notes (Signed)
Carolyn Garcia is here for follow up after treatment to her left breast.  She continues to report having pain in both of her breasts and underarms.  She reports she is taking norco mostly at night.  She is requesting a refill of norco today.  She does not take it during the day so that she can drive.  She reports having severe fatigue.    BP 108/86 (BP Location: Right Arm, Patient Position: Sitting)   Pulse 88   Temp 98.4 F (36.9 C) (Oral)   Ht 5\' 7"  (1.702 m)   Wt 128 lb 12.8 oz (58.4 kg)   SpO2 100%   BMI 20.17 kg/m    Wt Readings from Last 3 Encounters:  10/17/16 128 lb 12.8 oz (58.4 kg)  04/25/16 129 lb (58.5 kg)  11/10/15 127 lb 4.8 oz (57.7 kg)

## 2016-10-17 NOTE — Progress Notes (Signed)
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  Narrative:  The patient returns today for routine follow-up. Bilateral mammogram on 07/12/16 in light of her persistent pain of the breasts and questionable finding in the right breast revealed postsurgical changes in the left breast and no mammographic abnormalities in either breast. She continues to report having pain in both of her breasts and axillae. She reports she is taking norco mostly at night. She is requesting a refill of norco today. She does not take it during the day so that she can drive without impairment. She reports having severe fatigue. Denies nipple discharge/bleeding or lymphedema. She also reports severe pain in her hands, feet, and neck she attributes to her rheumatoid arthritis.   ALLERGIES:  is allergic to entex.  Meds: Current Outpatient Prescriptions  Medication Sig Dispense Refill  . ALPRAZolam (XANAX) 0.25 MG tablet TAKE 1 TABLET BY MOUTH EVERY 6 HOURS AS NEEDED FOR ANXIETY.  Called in to [facility] on [address] per Dr. Sondra Come.    . benzonatate (TESSALON) 200 MG capsule Take 200 mg by mouth 3 (three) times daily as needed.    . calcium citrate-vitamin D 200-200 MG-UNIT TABS Take 2 tablets by mouth daily. Reported on 04/25/2016    . cholecalciferol (VITAMIN D) 1000 UNITS tablet Take 2,000 Units by mouth daily.    . clotrimazole-betamethasone (LOTRISONE) cream Apply 1 application topically as needed. Reported on 04/25/2016    . cyclobenzaprine (FLEXERIL) 10 MG tablet Take 10 mg by mouth daily as needed.     . diclofenac sodium (VOLTAREN) 1 %  GEL APP EXT AA Q 6 H UTD PRN  2  . Golimumab (SIMPONI) 50 MG/0.5ML SOLN Inject 50 mg into the skin every 30 (thirty) days.    Marland Kitchen HYDROcodone-acetaminophen (NORCO/VICODIN) 5-325 MG tablet Take 2 tablets by mouth every 6 (six) hours as needed. 360 tablet 0  . hyoscyamine (ANASPAZ) 0.125 MG TBDP disintergrating tablet Place 0.125 mg under the tongue every 6 (six) hours as needed.    Marland Kitchen ipratropium (ATROVENT) 0.03 % nasal spray Reported on 04/25/2016    . levocetirizine (XYZAL) 5 MG tablet   5  . levothyroxine (SYNTHROID, LEVOTHROID) 75 MCG tablet Take 75 mcg by mouth daily before breakfast.    . lidocaine (XYLOCAINE) 5 % ointment Reported on 04/25/2016  3  . LIDODERM 5 % Place onto the skin as needed.    . montelukast (SINGULAIR) 10 MG tablet     . Multiple Vitamin (MULTIVITAMIN) tablet Take 1 tablet by mouth daily.    . nortriptyline (PAMELOR) 25 MG capsule Reported on 04/25/2016  3   No current facility-administered medications for this encounter.     Physical Findings: The patient is in no acute distress. Patient is alert and oriented.  height is 5\' 7"  (1.702 m) and weight is 128 lb 12.8 oz (58.4 kg). Her oral temperature is 98.4 F (36.9 C). Her blood pressure is 108/86 and her pulse is 88. Her oxygen saturation is 100%.   No palpable cervical supraclavicular or axillary adenopathy. Lungs are clear to auscultation. The heart has a regular rhythm and rate.  Right breast no palpable mass or nipple discharge. Continues to be tender with palpation in the upper right breast area. Left breast, indention along the lumpectomy scar. Noted on previous exams. Pain along the upper inner portion of the left breast, question a cyst or mass in this area.  Lab Findings: Lab Results  Component Value Date   WBC 4.2 04/13/2015   HGB 12.6 04/13/2015   HCT 38.4 04/13/2015   MCV 93.1 04/13/2015   PLT 220 04/13/2015    Radiographic Findings: No results found.  Impression:   Stage IIA poorly differentiated  invasive ductal carcinoma of the left breast.  No evidence for recurrence on clinical exam today. The patient continues to be anxious about her overall diagnosis of breast cancer and concern for recurrence. I mentioned survivorship with her and she would not like to pursue this issue at this time. She continues to have chronic pain in her breasts and will continue on hydrocodone for this issue. She will get labs through her rheumatologist.  Plan:  Routine followup in 6 months. I refilled her Norco today. Ultrasound of the left breast to be scheduled. ____________________________________ Blair Promise, PhD, MD  This document serves as a record of services personally performed by Gery Pray, MD. It was created on his behalf by Darcus Austin, a trained medical scribe. The creation of this record is based on the scribe's personal observations and the provider's statements to them. This document has been checked and approved by the attending provider.

## 2016-10-18 ENCOUNTER — Other Ambulatory Visit: Payer: Self-pay | Admitting: Oncology

## 2016-10-18 ENCOUNTER — Telehealth: Payer: Self-pay | Admitting: *Deleted

## 2016-10-18 DIAGNOSIS — C50412 Malignant neoplasm of upper-outer quadrant of left female breast: Secondary | ICD-10-CM

## 2016-10-18 NOTE — Telephone Encounter (Signed)
Called patient to inform of [place] @ [facility], lvm for a return call

## 2016-10-23 ENCOUNTER — Other Ambulatory Visit: Payer: Medicare Other

## 2016-10-23 NOTE — Addendum Note (Signed)
Encounter addended by: Rosalie Buenaventura R Woodward Klem, RN on: 10/23/2016  8:07 AM<BR>    Actions taken: Charge Capture section accepted

## 2016-11-01 ENCOUNTER — Other Ambulatory Visit: Payer: Medicare Other

## 2016-11-08 ENCOUNTER — Ambulatory Visit: Payer: Self-pay | Admitting: Radiation Oncology

## 2016-11-28 ENCOUNTER — Telehealth: Payer: Self-pay | Admitting: Oncology

## 2016-11-28 NOTE — Telephone Encounter (Signed)
Carolyn Garcia called and said she is having pain now on her right side.  She is scheduled to have a mammogram and ultrasound of her left side and is wondering if the right side can be done at the same time.  Advised her that Dr. Sondra Come will be contacted and that we will call her back.

## 2016-11-30 ENCOUNTER — Other Ambulatory Visit: Payer: Self-pay | Admitting: Oncology

## 2016-11-30 DIAGNOSIS — C50412 Malignant neoplasm of upper-outer quadrant of left female breast: Secondary | ICD-10-CM

## 2016-11-30 NOTE — Telephone Encounter (Signed)
Left a message for Carolyn Garcia advising her that Dr. Sondra Come has ordered a mammogram and ultrasound of her right breast as well due to her right breast pain.

## 2016-12-05 ENCOUNTER — Ambulatory Visit
Admission: RE | Admit: 2016-12-05 | Discharge: 2016-12-05 | Disposition: A | Discharge status: Home / Self Care | Payer: Medicare Other | Source: Ambulatory Visit | Attending: Radiation Oncology | Admitting: Radiation Oncology

## 2016-12-05 DIAGNOSIS — C50412 Malignant neoplasm of upper-outer quadrant of left female breast: Secondary | ICD-10-CM

## 2017-01-11 ENCOUNTER — Telehealth: Payer: Self-pay | Admitting: Oncology

## 2017-01-11 NOTE — Telephone Encounter (Signed)
Carolyn Garcia called and said she will need a refill on her pain medication.  She last had it filled on 10/17/16.  She also said she received a letter from the breast center about her ultrasound/mammogram results from 12/05/16.  She said it concerned her because it said "the findings are benign or probably benign" and to have another screening mammogram in 4 months.  She said the doctor who preformed the ultrasounds had told her that everything was normal.  She is worried about the "probably benign" statement.  Advised her that the ultrasound report does not have any abnormal findings and that I will discuss this with Dr. Sondra Come on Tuesday and call her back.  Carolyn Garcia verbalized understanding and agreement.

## 2017-01-17 ENCOUNTER — Other Ambulatory Visit: Payer: Self-pay | Admitting: Radiation Oncology

## 2017-01-17 MED ORDER — HYDROCODONE-ACETAMINOPHEN 5-325 MG PO TABS: 2.0000 | ORAL_TABLET | Freq: Four times a day (QID) | ORAL | 0 refills | Status: DC | PRN

## 2017-01-17 NOTE — Telephone Encounter (Signed)
Left a message for Carolyn Garcia advising her that her pain medication is ready for pick up in the radiation nursing area.  Also advised her that Dr. Sondra Come said her ultrasound report looked good.  Advised her to call back with any questions.

## 2017-01-17 NOTE — Telephone Encounter (Signed)
Carolyn Garcia called and left a message asking about her pain medication refill.  She requested a return call.

## 2017-03-06 ENCOUNTER — Other Ambulatory Visit: Payer: Self-pay | Admitting: Internal Medicine

## 2017-03-06 DIAGNOSIS — R590 Localized enlarged lymph nodes: Secondary | ICD-10-CM

## 2017-03-14 ENCOUNTER — Ambulatory Visit
Admission: RE | Admit: 2017-03-14 | Discharge: 2017-03-14 | Disposition: A | Discharge status: Home / Self Care | Payer: Medicare Other | Source: Ambulatory Visit | Attending: Internal Medicine | Admitting: Internal Medicine

## 2017-03-14 DIAGNOSIS — R590 Localized enlarged lymph nodes: Secondary | ICD-10-CM

## 2017-04-18 ENCOUNTER — Ambulatory Visit
Admission: RE | Admit: 2017-04-18 | Discharge: 2017-04-18 | Disposition: A | Discharge status: Home / Self Care | Payer: Medicare Other | Source: Ambulatory Visit | Attending: Radiation Oncology | Admitting: Radiation Oncology

## 2017-04-18 DIAGNOSIS — C50912 Malignant neoplasm of unspecified site of left female breast: Secondary | ICD-10-CM | POA: Insufficient documentation

## 2017-04-18 DIAGNOSIS — M069 Rheumatoid arthritis, unspecified: Secondary | ICD-10-CM | POA: Diagnosis not present

## 2017-04-18 DIAGNOSIS — Z79899 Other long term (current) drug therapy: Secondary | ICD-10-CM | POA: Insufficient documentation

## 2017-04-18 DIAGNOSIS — C50412 Malignant neoplasm of upper-outer quadrant of left female breast: Secondary | ICD-10-CM

## 2017-04-18 MED ORDER — HYDROCODONE-ACETAMINOPHEN 5-325 MG PO TABS: 2.0000 | ORAL_TABLET | Freq: Four times a day (QID) | ORAL | 0 refills | Status: DC | PRN

## 2017-04-18 NOTE — Progress Notes (Signed)
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  Narrative:  The patient returns today for routine follow-up. She reports 5/10 pain in both breasts and underarms. She describes the pain as deep, throbbing, and burning. She manages the pain with 2 tablets of hydrocodone every 6 hours. She reports she will see Dr. Wilburn Cornelia on 04/26/17 due to a bump under her right ear. She already had an ultrasound of a similar area of the left ear. She reports fatigue.  ALLERGIES:  is allergic to entex.  Meds: Current Outpatient Prescriptions  Medication Sig Dispense Refill  . ALPRAZolam (XANAX) 0.25 MG tablet TAKE 1 TABLET BY MOUTH EVERY 6 HOURS AS NEEDED FOR ANXIETY.  Called in to [facility] on [address] per Dr. Sondra Come.    . benzonatate (TESSALON) 200 MG capsule Take 200 mg by mouth 3 (three) times daily as needed.    . calcium citrate-vitamin D 200-200 MG-UNIT TABS Take 2 tablets by mouth daily. Reported on 04/25/2016    . cholecalciferol (VITAMIN D) 1000 UNITS tablet Take 2,000 Units by mouth daily.    . cyclobenzaprine (FLEXERIL) 10 MG tablet Take 10 mg by mouth daily as needed.     . diclofenac sodium (VOLTAREN) 1 % GEL APP EXT AA Q 6 H UTD PRN  2  . Golimumab (SIMPONI) 50 MG/0.5ML SOLN Inject 50 mg into the skin every 30 (thirty) days.    Marland Kitchen HYDROcodone-acetaminophen (NORCO/VICODIN) 5-325 MG tablet Take 2 tablets by mouth every 6 (six) hours as needed. 360 tablet 0  . levocetirizine (XYZAL) 5 MG tablet   5  . levothyroxine (SYNTHROID, LEVOTHROID) 75 MCG tablet Take 75 mcg by mouth daily before breakfast.    . LIDODERM 5 % Place  onto the skin as needed.    . montelukast (SINGULAIR) 10 MG tablet     . Multiple Vitamin (MULTIVITAMIN) tablet Take 1 tablet by mouth daily.    . hyoscyamine (ANASPAZ) 0.125 MG TBDP disintergrating tablet Place 0.125 mg under the tongue every 6 (six) hours as needed.     No current facility-administered medications for this encounter.     Physical Findings: The patient is in no acute distress. Patient is alert and oriented.  height is 5\' 7"  (1.702 m) and weight is 124 lb (56.2 kg). Her oral temperature is 98.3 F (36.8 C). Her blood pressure is 129/89 and her pulse is 86. Her oxygen saturation is 100%.   No palpable cervical supraclavicular or axillary adenopathy. Lungs are clear to auscultation. The heart has a regular rhythm and rate.  Right breast no palpable mass or nipple discharge. Continues to be tender with palpation in the upper right breast area. Left breast, indention along the lumpectomy scar. No palpable mass or discharge. Tender with palpation.   Patient has a small approximately 3-4 mm lymph node along the right infraauricular area. No other adenopathy appreciated in the neck region.  Lab Findings: Lab Results  Component Value Date   WBC 4.2 04/13/2015   HGB 12.6 04/13/2015   HCT 38.4 04/13/2015   MCV 93.1 04/13/2015   PLT 220 04/13/2015    Radiographic Findings:  No results found.  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  No evidence for recurrence on clinical exam today.   Plan:  Routine follow-up in 6 months. Ordered a refill of hydrocodone. ENT as above. ____________________________________ Blair Promise, PhD, MD  This document serves as a record of services personally performed by Gery Pray, MD. It was created on his behalf by Bethann Humble, a trained medical scribe. The creation of this record is based on the scribe's personal observations and the provider's statements to them. This document has been  checked and approved by the attending provider.

## 2017-04-18 NOTE — Progress Notes (Signed)
Carolyn Garcia is here for follow up.  She reports having pain at a 5/10 in both breast and underarms.  She reports the pain in her right breast is deep and is burning and throbbing.  She is using hydrocodone 2 tablets q 6 hours and said it wears off after 4 hours if it is severe.  She is requested a refill.  She reports having a bump under her right ear.  She will see Dr. Wilburn Cornelia with ENT the first Friday in May. She said she also has one on under her left ear that they could see on ultrasound.  She reports having fatigue.  Her brother recently passed away.  BP 129/89 (BP Location: Right Arm, Patient Position: Sitting)   Pulse 86   Temp 98.3 F (36.8 C) (Oral)   Ht 5\' 7"  (1.702 m)   Wt 124 lb (56.2 kg)   SpO2 100%   BMI 19.42 kg/m    Wt Readings from Last 3 Encounters:  04/18/17 124 lb (56.2 kg)  10/17/16 128 lb 12.8 oz (58.4 kg)  04/25/16 129 lb (58.5 kg)

## 2017-04-23 ENCOUNTER — Telehealth: Payer: Self-pay | Admitting: *Deleted

## 2017-04-23 NOTE — Telephone Encounter (Signed)
CALLED PATIENT TO INFORM OF 6 MONTH FU ON 10-21-17 @ 4 PM WITH DR. KINARD, LVM FOR A RETURN CALL

## 2017-05-02 ENCOUNTER — Other Ambulatory Visit: Payer: Self-pay | Admitting: Otolaryngology

## 2017-05-02 DIAGNOSIS — R221 Localized swelling, mass and lump, neck: Secondary | ICD-10-CM

## 2017-05-15 ENCOUNTER — Ambulatory Visit
Admission: RE | Admit: 2017-05-15 | Discharge: 2017-05-15 | Disposition: A | Discharge status: Home / Self Care | Payer: Medicare Other | Source: Ambulatory Visit | Attending: Otolaryngology | Admitting: Otolaryngology

## 2017-05-15 DIAGNOSIS — R221 Localized swelling, mass and lump, neck: Secondary | ICD-10-CM

## 2017-05-15 MED ORDER — IOPAMIDOL (ISOVUE-300) INJECTION 61%
75.0000 mL | Freq: Once | INTRAVENOUS | Status: AC | PRN
  Administered 2017-05-15: 75 mL via INTRAVENOUS

## 2017-06-03 ENCOUNTER — Telehealth: Payer: Self-pay | Admitting: Oncology

## 2017-06-03 NOTE — Telephone Encounter (Signed)
Patient called and said that a lung nodule was found on her recent CT scan.  She would like to schedule and appointment with Dr. Sondra Come to discuss the results.  Transferred her to Romie Jumper, Medical Secretary to schedule.

## 2017-06-10 ENCOUNTER — Encounter: Payer: Self-pay | Admitting: Radiation Oncology

## 2017-06-10 ENCOUNTER — Ambulatory Visit
Admission: RE | Admit: 2017-06-10 | Discharge: 2017-06-10 | Disposition: A | Discharge status: Home / Self Care | Payer: Medicare Other | Source: Ambulatory Visit | Attending: Radiation Oncology | Admitting: Radiation Oncology

## 2017-06-10 VITALS — BP 122/82 | HR 83 | Temp 98.3°F | Resp 18 | Wt 124.8 lb

## 2017-06-10 DIAGNOSIS — C50912 Malignant neoplasm of unspecified site of left female breast: Secondary | ICD-10-CM | POA: Diagnosis not present

## 2017-06-10 DIAGNOSIS — C50412 Malignant neoplasm of upper-outer quadrant of left female breast: Secondary | ICD-10-CM

## 2017-06-10 NOTE — Progress Notes (Signed)
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  Narrative:  The patient returns today for routine follow-up of radiation completed 05/12/2003.  On review of systems, the patient reports pain in bilateral breasts such that she must avoid laying on her side. She has a history of rheumatoid arthritis. She reports fatigue. She reports a poor appetite. The patient reports shortness of breath with exertion. She is unable to do certain things because of the fatigue and shortness of breath. She reports taking Methotrexate for many years has caused her to have sensitivity and bleeding in her genital region. She questions what products if any she could use to alleviate this since she cannot take Estrogen containing products due to prior history of breast cancer ([place]: prasterone: vaginal)  The patient reports she had a neck CT on 05/15/17 which showed a nodule on her lung. She is very concerned about this. She has never smoked, but has a lifelong history of secondhand smoke exposure from her parents. Patient will have follow-up on the palpable small nodule in the right upper neck Dr. Wilburn Cornelia in the next few months  ALLERGIES:  is allergic to entex.  Meds: Current Outpatient Prescriptions  Medication Sig Dispense Refill  . ALPRAZolam (XANAX) 0.25 MG tablet TAKE 1 TABLET BY MOUTH EVERY 6 HOURS AS NEEDED FOR ANXIETY.  Called in to [facility] on [address] per Dr. Sondra Come.    . benzonatate (TESSALON) 200 MG capsule Take 200 mg by mouth 3 (three) times daily as needed.    . cholecalciferol (VITAMIN D) 1000 UNITS tablet Take 2,000  Units by mouth daily.    . cyclobenzaprine (FLEXERIL) 10 MG tablet Take 10 mg by mouth daily as needed.     . diclofenac sodium (VOLTAREN) 1 % GEL APP EXT AA Q 6 H UTD PRN  2  . fluticasone (FLONASE) 50 MCG/ACT nasal spray Place 2 sprays into both nostrils daily as needed.    . Golimumab (SIMPONI) 50 MG/0.5ML SOLN Inject 50 mg into the skin every 30 (thirty) days.    Marland Kitchen HYDROcodone-acetaminophen (NORCO/VICODIN) 5-325 MG tablet Take 2 tablets by mouth every 6 (six) hours as needed. 360 tablet 0  . hyoscyamine (ANASPAZ) 0.125 MG TBDP disintergrating tablet Place 0.125 mg under the tongue every 6 (six) hours as needed.    Marland Kitchen levocetirizine (XYZAL) 5 MG tablet   5  . levothyroxine (SYNTHROID, LEVOTHROID) 75 MCG tablet Take 75 mcg by mouth daily before breakfast.    . LIDODERM 5 % Place onto the skin as needed.    . montelukast (SINGULAIR) 10 MG tablet     . Multiple Vitamin (MULTIVITAMIN) tablet Take 1 tablet by mouth daily.    . Biotin 5 MG TBDP Take 1 tablet by mouth daily.    . calcium citrate-vitamin D 200-200 MG-UNIT TABS Take 2 tablets by mouth daily. Reported on 04/25/2016     No current facility-administered medications for this encounter.      Physical Findings: The patient is in no acute distress. Patient is alert and oriented.  weight is 124 lb 12.8 oz (56.6 kg). Her oral temperature is 98.3 F (36.8  C). Her blood pressure is 122/82 and her pulse is 83. Her respiration is 18 and oxygen saturation is 99%.   No palpable cervical, supraclavicular, or axillary adenopathy. Lungs are clear to auscultation. The heart has a regular rhythm and rate.  In the right inferior auricular area pt has small 3-4 mm nodule which is quite soft and consistent with a cyst.  Breast exam not performed today in light of her recent exam.   Lab Findings: Lab Results  Component Value Date   WBC 4.2 04/13/2015   HGB 12.6 04/13/2015   HCT 38.4 04/13/2015   MCV 93.1 04/13/2015   PLT 220 04/13/2015     Radiographic Findings: Ct Soft Tissue Neck W Contrast  Result Date: 05/15/2017 CLINICAL DATA:  51 y/o F; submandibular nodules on right-sided neck for 6 months. History of left breast cancer post lumpectomy, radiation, and chemotherapy. EXAM: CT NECK WITH CONTRAST TECHNIQUE: Multidetector CT imaging of the neck was performed using the standard protocol following the bolus administration of intravenous contrast. CONTRAST:  8mL ISOVUE-300 IOPAMIDOL (ISOVUE-300) INJECTION 61% COMPARISON:  08/09/2008 CT of the neck. FINDINGS: Pharynx and larynx: Normal. No mass or swelling. Salivary glands: No inflammation, mass, or stone. Thyroid: Postsurgical changes related to thyroidectomy. Lymph nodes: None enlarged or abnormal density. Vascular: Negative. Limited intracranial: Negative. Visualized orbits: Negative. Mastoids and visualized paranasal sinuses: Clear. Skeleton: No acute or aggressive process. Upper chest: 5 mm nodule in left lung apex. Bronchiectasis and cystic changes in the left upper lobe. Other: None. IMPRESSION: 1. No mass or fluid collection in the area of interest. 2. 5 mm left lung apex nodule. No follow-up needed if patient is low-risk. Non-contrast chest CT can be considered in 12 months if patient is high-risk. This recommendation follows the consensus statement: Guidelines for Management of Incidental Pulmonary Nodules Detected on CT Images: From the Fleischner Society 2017; Radiology 2017; 284:228-243. 3. Left upper lobe bronchiectasis and cystic changes. Electronically Signed   By: Kristine Garbe M.D.   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 No evidence for disease recurrence.   Patient is a nonsmoker but does have household smoke exposure; her mother smokes approximately 1 ppd.   Plan:  Breast follow up in October. She will also be scheduled for chest CT at that time for follow up of solitary pulmonary  nodule.   -----------------------------------  Blair Promise, PhD, MD  This document serves as a record of services personally performed by Gery Pray, MD. It was created on his behalf by Maryla Morrow, a trained medical scribe. The creation of this record is based on the scribe's personal observations and the provider's statements to them. This document has been checked and approved by the attending provider.

## 2017-06-10 NOTE — Progress Notes (Addendum)
Carolyn Garcia is here today for follow up.  She was treated to left breast over 13 years ago.  Patient reports having pain in bilateral breasts that she has to avoid laying on her side because of the pain, neck, lower back.  Patient has history of Rheumatoid Arthritis.  Patient states she had a CT of her neck and during the CT the Radiologist noted a nodule on her lung.  She is very concerned about the nodule and wanted Dr. Lois Huxley opinion on it.  Patient denies having a history of smoking.   Patient reports having fatigue and does not have a good appetite.  Reports having shortness of breath with exertion.  States she is unable to do certain things because of the fatigue and shortness of breath.     Vitals:   06/10/17 1645  BP: 122/82  Pulse: 83  Resp: 18  Temp: 98.3 F (36.8 C)  TempSrc: Oral  SpO2: 99%  Weight: 124 lb 12.8 oz (56.6 kg)   Wt Readings from Last 3 Encounters:  06/10/17 124 lb 12.8 oz (56.6 kg)  04/18/17 124 lb (56.2 kg)  10/17/16 128 lb 12.8 oz (58.4 kg)

## 2017-07-02 ENCOUNTER — Telehealth: Payer: Self-pay | Admitting: Oncology

## 2017-07-02 ENCOUNTER — Other Ambulatory Visit: Payer: Self-pay | Admitting: Radiation Oncology

## 2017-07-02 DIAGNOSIS — C50412 Malignant neoplasm of upper-outer quadrant of left female breast: Secondary | ICD-10-CM

## 2017-07-02 MED ORDER — HYDROCODONE-ACETAMINOPHEN 5-325 MG PO TABS: 2.0000 | ORAL_TABLET | Freq: Four times a day (QID) | ORAL | 0 refills | Status: DC | PRN

## 2017-07-02 NOTE — Telephone Encounter (Addendum)
Called patient back and advised her that her pain medication prescription is ready for pick up in the Radiation nursing area.  Also discussed the Josph Macho Touch procedure that Dr. Sondra Come recommended which would replace the need for hormone therapy.

## 2017-07-02 NOTE — Telephone Encounter (Signed)
Called patient back and advised her that Dr. Sondra Come has reviewed the medication discussed at her last follow up Carolyn Garcia) and said it is not any safer than Estrogen.  Carolyn Garcia verbalized understanding.  Also advised that I will call her back when the pain medication is refilled.

## 2017-07-02 NOTE — Telephone Encounter (Signed)
Maleta left a message asking if Dr. Sondra Come has made a decision regarding medications discussed at her last follow up.  She also is requesting a refill on her pain medication - hydrocodone/acetaminophen - that was last filled on 04/18/17.

## 2017-10-08 ENCOUNTER — Telehealth: Payer: Self-pay | Admitting: Oncology

## 2017-10-08 NOTE — Telephone Encounter (Signed)
Patient called and said she may need a refill on her pain medication.  She said she is taking 3-4 tablets per day and has 30 left.  She will see Dr. Sondra Garcia on 10/29 and may run short.  She will call next week Wednesday if she will need a refill before her appointment.

## 2017-10-09 NOTE — Telephone Encounter (Signed)
Patient left a message saying that she does want a refill on her pain medication.

## 2017-10-10 ENCOUNTER — Other Ambulatory Visit: Payer: Self-pay | Admitting: Radiation Oncology

## 2017-10-10 DIAGNOSIS — C50412 Malignant neoplasm of upper-outer quadrant of left female breast: Secondary | ICD-10-CM

## 2017-10-10 MED ORDER — HYDROCODONE-ACETAMINOPHEN 5-325 MG PO TABS: 2.0000 | ORAL_TABLET | Freq: Four times a day (QID) | ORAL | 0 refills | Status: DC | PRN

## 2017-10-21 ENCOUNTER — Ambulatory Visit
Admission: RE | Admit: 2017-10-21 | Discharge: 2017-10-21 | Disposition: A | Discharge status: Home / Self Care | Payer: Medicare Other | Source: Ambulatory Visit | Attending: Radiation Oncology | Admitting: Radiation Oncology

## 2017-10-21 ENCOUNTER — Encounter: Payer: Self-pay | Admitting: Radiation Oncology

## 2017-10-21 DIAGNOSIS — R0602 Shortness of breath: Secondary | ICD-10-CM | POA: Insufficient documentation

## 2017-10-21 DIAGNOSIS — M25551 Pain in right hip: Secondary | ICD-10-CM | POA: Diagnosis not present

## 2017-10-21 DIAGNOSIS — Z853 Personal history of malignant neoplasm of breast: Secondary | ICD-10-CM | POA: Insufficient documentation

## 2017-10-21 DIAGNOSIS — Z888 Allergy status to other drugs, medicaments and biological substances status: Secondary | ICD-10-CM | POA: Insufficient documentation

## 2017-10-21 DIAGNOSIS — Z08 Encounter for follow-up examination after completed treatment for malignant neoplasm: Secondary | ICD-10-CM | POA: Diagnosis present

## 2017-10-21 DIAGNOSIS — Z79890 Hormone replacement therapy (postmenopausal): Secondary | ICD-10-CM | POA: Diagnosis not present

## 2017-10-21 DIAGNOSIS — R911 Solitary pulmonary nodule: Secondary | ICD-10-CM | POA: Insufficient documentation

## 2017-10-21 DIAGNOSIS — C50412 Malignant neoplasm of upper-outer quadrant of left female breast: Secondary | ICD-10-CM

## 2017-10-21 DIAGNOSIS — Z923 Personal history of irradiation: Secondary | ICD-10-CM | POA: Insufficient documentation

## 2017-10-21 DIAGNOSIS — R05 Cough: Secondary | ICD-10-CM | POA: Diagnosis not present

## 2017-10-21 DIAGNOSIS — Z79899 Other long term (current) drug therapy: Secondary | ICD-10-CM | POA: Diagnosis not present

## 2017-10-21 NOTE — Progress Notes (Signed)
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  Narrative:  The patient returns today for routine follow-up of radiation completed 05/12/2003. She states she is still being followed by Dr. Wilburn Cornelia.   On review of systems, she reports throbbing pain to the right breast. She reports some pain to the left breast as well, but not as significant as the right. She reports increased anxiety due to the last CT scan findings. She reports intermittent low back and right hip pain and was seen by Dr. Amil Amen and is to follow up with him in the next couple months secondary to degenerative changes. The back pain wakes her at night occasionally and she can only sleep on her back. She has been taking Vicodin x 2 at bedtime to treat the pain. Pt expresses concern that the pain may be more than just degenerative changes. She also reports increased fatigue, intermittent dry cough and SOB.    ALLERGIES:  is allergic to entex and tyloxapol.  Meds: Current Outpatient Prescriptions  Medication Sig Dispense Refill  . ALPRAZolam (XANAX) 0.25 MG tablet TAKE 1 TABLET BY MOUTH EVERY 6 HOURS AS NEEDED FOR ANXIETY.  Called in to [facility] on [address] per Dr. Sondra Come.    . Biotin 5 MG TBDP Take 1 tablet by mouth daily.    . cholecalciferol (VITAMIN D) 1000 UNITS tablet Take 2,000 Units by mouth daily.    . cyclobenzaprine (FLEXERIL) 10 MG tablet Take 10 mg by mouth daily as needed.     . diclofenac sodium (VOLTAREN) 1 % GEL APP EXT AA Q 6 H UTD PRN  2  . fluticasone (FLONASE) 50 MCG/ACT nasal spray 2 sprays by Each Nare route nightly for 30 days.     . Golimumab (SIMPONI) 50 MG/0.5ML SOLN Inject 50 mg into the skin every 30 (thirty) days.    Marland Kitchen HYDROcodone-acetaminophen (NORCO/VICODIN) 5-325 MG tablet Take 2 tablets by mouth every 6 (six) hours as needed. 360 tablet 0  . levocetirizine (XYZAL) 5 MG tablet   5  . levothyroxine (SYNTHROID, LEVOTHROID) 75 MCG tablet Take 75 mcg by mouth daily before breakfast.    . LIDODERM 5 % Place onto the skin as needed.    . montelukast (SINGULAIR) 10 MG tablet     . Multiple Vitamin (MULTIVITAMIN) tablet Take 1 tablet by mouth daily.    . benzonatate (TESSALON) 200 MG capsule Take 200 mg by mouth 3 (three) times daily as needed.    . calcium citrate-vitamin D 200-200 MG-UNIT TABS Take 2 tablets by mouth daily. Reported on 04/25/2016    . hyoscyamine (ANASPAZ) 0.125 MG TBDP disintergrating tablet Place 0.125 mg under the tongue every 6 (six) hours as needed.     No current facility-administered medications for this encounter.      Physical Findings: The patient is in no acute distress. Patient is alert and oriented.  height is 5\' 7"  (1.702 m) and weight is 126 lb (57.2 kg). Her oral temperature is 98.4 F (36.9 C). Her blood pressure is 106/83 and her pulse is 91. Her oxygen saturation is 100%.    No palpable cervical, supraclavicular,  or axillary adenopathy. Lungs are clear to auscultation. The heart has a regular rhythm and rate.  Left breast with no palpable mass or nipple discharge. Patient has somewhat of an indention at her lumpectomy scar which has been noted on previous exams.  Right breast with no palpable mass, nipple discharge or bleeding. Both breasts are soft. Patient continues to be tender with palpation throughout both breasts.   Lab Findings: Lab Results  Component Value Date   WBC 4.2 04/13/2015   HGB 12.6 04/13/2015   HCT 38.4 04/13/2015   MCV 93.1 04/13/2015   PLT 220 04/13/2015    Radiographic Findings: No results found.   Impression:  Stage IIA poorly differentiated  invasive ductal carcinoma of the left breast.    No evidence of recurrence on clinical exam.    Plan:  Pt will proceed with CT chest for follow up of her pulmonary nodule. She will also proceed with breast imaging next month as part of her yearly evaluation. Routine follow up in 6 months. Pt will follow up with her rheumatologist concerning her right hip pain.    -----------------------------------  Blair Promise, PhD, MD  This document serves as a record of services personally performed by Gery Pray, MD. It was created on his behalf by Marlowe Kays, a trained medical scribe. The creation of this record is based on the scribe's personal observations and the provider's statements to them. This document has been checked and approved by the attending provider.

## 2017-10-21 NOTE — Progress Notes (Signed)
Carolyn Garcia is here for follow up.  She continues to have pain at a 7/10 in her right breast and lower back.  She is taking hydrocodone acetaminophen 3-4 times a day.  She reports having a poor energy level.  She is nervous about having a repeat CT scan for the lung nodule seen on her last scan.  She reports having an occasional dry cough and shortness of breath.    BP 106/83 (BP Location: Right Arm, Patient Position: Sitting)   Pulse 91   Temp 98.4 F (36.9 C) (Oral)   Ht 5\' 7"  (1.702 m)   Wt 126 lb (57.2 kg)   SpO2 100%   BMI 19.73 kg/m    Wt Readings from Last 3 Encounters:  10/21/17 126 lb (57.2 kg)  06/10/17 124 lb 12.8 oz (56.6 kg)  04/18/17 124 lb (56.2 kg)

## 2017-10-25 ENCOUNTER — Telehealth: Payer: Self-pay | Admitting: *Deleted

## 2017-10-25 NOTE — Telephone Encounter (Signed)
CALLED PATIENT TO INFORM OF CT FOR 10-28-17 - ARRIVAL TIME - 3:45 PM @ [facility], NO RESTRICTIONS TO TEST, LVM FOR A RETURN CALL

## 2017-10-28 ENCOUNTER — Ambulatory Visit (HOSPITAL_COMMUNITY)
Admission: RE | Admit: 2017-10-28 | Discharge: 2017-10-28 | Disposition: A | Discharge status: Home / Self Care | Payer: Medicare Other | Source: Ambulatory Visit | Attending: Radiation Oncology | Admitting: Radiation Oncology

## 2017-10-28 DIAGNOSIS — J479 Bronchiectasis, uncomplicated: Secondary | ICD-10-CM | POA: Insufficient documentation

## 2017-10-28 DIAGNOSIS — R911 Solitary pulmonary nodule: Secondary | ICD-10-CM | POA: Diagnosis not present

## 2017-10-28 DIAGNOSIS — C50412 Malignant neoplasm of upper-outer quadrant of left female breast: Secondary | ICD-10-CM | POA: Insufficient documentation

## 2017-10-28 DIAGNOSIS — J984 Other disorders of lung: Secondary | ICD-10-CM | POA: Insufficient documentation

## 2017-10-29 ENCOUNTER — Telehealth: Payer: Self-pay | Admitting: Oncology

## 2017-10-29 NOTE — Telephone Encounter (Signed)
Left a message for patient regarding her CT scan results.  Requested a return call. 

## 2017-10-29 NOTE — Telephone Encounter (Signed)
Patient returned call.  Discussed CT scan results.  She verbalized understanding and agreement.

## 2017-11-01 ENCOUNTER — Other Ambulatory Visit: Payer: Self-pay | Admitting: Radiation Oncology

## 2017-11-01 DIAGNOSIS — Z139 Encounter for screening, unspecified: Secondary | ICD-10-CM

## 2017-12-05 ENCOUNTER — Other Ambulatory Visit: Payer: Self-pay | Admitting: Internal Medicine

## 2017-12-05 DIAGNOSIS — M25551 Pain in right hip: Secondary | ICD-10-CM

## 2017-12-06 ENCOUNTER — Ambulatory Visit
Admission: RE | Admit: 2017-12-06 | Discharge: 2017-12-06 | Disposition: A | Discharge status: Home / Self Care | Payer: Medicare Other | Source: Ambulatory Visit | Attending: Radiation Oncology | Admitting: Radiation Oncology

## 2017-12-06 DIAGNOSIS — Z139 Encounter for screening, unspecified: Secondary | ICD-10-CM

## 2017-12-06 HISTORY — DX: Personal history of antineoplastic chemotherapy: Z92.21

## 2017-12-06 HISTORY — DX: Personal history of irradiation: Z92.3

## 2017-12-18 ENCOUNTER — Ambulatory Visit
Admission: RE | Admit: 2017-12-18 | Discharge: 2017-12-18 | Disposition: A | Discharge status: Home / Self Care | Payer: Medicare Other | Source: Ambulatory Visit | Attending: Internal Medicine | Admitting: Internal Medicine

## 2017-12-18 DIAGNOSIS — M25551 Pain in right hip: Secondary | ICD-10-CM

## 2017-12-21 ENCOUNTER — Other Ambulatory Visit: Payer: Self-pay | Admitting: Internal Medicine

## 2017-12-25 ENCOUNTER — Other Ambulatory Visit: Payer: Self-pay | Admitting: Internal Medicine

## 2017-12-25 DIAGNOSIS — M25551 Pain in right hip: Secondary | ICD-10-CM

## 2018-01-03 ENCOUNTER — Ambulatory Visit
Admission: RE | Admit: 2018-01-03 | Discharge: 2018-01-03 | Disposition: A | Discharge status: Home / Self Care | Payer: Medicare Other | Source: Ambulatory Visit | Attending: Internal Medicine | Admitting: Internal Medicine

## 2018-01-03 DIAGNOSIS — M25551 Pain in right hip: Secondary | ICD-10-CM

## 2018-01-07 ENCOUNTER — Telehealth: Payer: Self-pay | Admitting: Oncology

## 2018-01-07 ENCOUNTER — Other Ambulatory Visit: Payer: Self-pay | Admitting: Radiation Oncology

## 2018-01-07 MED ORDER — HYDROCODONE-ACETAMINOPHEN 5-325 MG PO TABS: 2.0000 | ORAL_TABLET | Freq: Four times a day (QID) | ORAL | 0 refills | Status: DC | PRN

## 2018-01-07 NOTE — Telephone Encounter (Signed)
Patient called and requested a refill on HYDROcodone-acetaminophen (NORCO/VICODIN) 5-325 MG.  She last had it filled on 10/10/17.

## 2018-04-02 ENCOUNTER — Other Ambulatory Visit: Payer: Self-pay | Admitting: Radiation Oncology

## 2018-04-02 ENCOUNTER — Telehealth: Payer: Self-pay | Admitting: Oncology

## 2018-04-02 MED ORDER — HYDROCODONE-ACETAMINOPHEN 5-325 MG PO TABS: 1.0000 | ORAL_TABLET | Freq: Four times a day (QID) | ORAL | 0 refills | Status: DC | PRN

## 2018-04-02 NOTE — Telephone Encounter (Signed)
Patient called and requested a refill on hydrocodone/acetaminophen which was last filled on 01/07/18.  She also mentioned that she had to pay out of pocket for her last perscription.  She said the pharmacist said it needs to be for 1-2 tablets q 4-6 hours prn for 360 tablets so that her insurance will cover it.

## 2018-04-21 ENCOUNTER — Encounter: Payer: Self-pay | Admitting: Radiation Oncology

## 2018-04-21 ENCOUNTER — Other Ambulatory Visit: Payer: Self-pay

## 2018-04-21 ENCOUNTER — Ambulatory Visit
Admission: RE | Admit: 2018-04-21 | Discharge: 2018-04-21 | Disposition: A | Discharge status: Home / Self Care | Payer: Medicare Other | Source: Ambulatory Visit | Attending: Radiation Oncology | Admitting: Radiation Oncology

## 2018-04-21 VITALS — BP 111/86 | HR 99 | Temp 98.4°F | Resp 18 | Wt 122.4 lb

## 2018-04-21 DIAGNOSIS — R911 Solitary pulmonary nodule: Secondary | ICD-10-CM | POA: Diagnosis not present

## 2018-04-21 DIAGNOSIS — C50412 Malignant neoplasm of upper-outer quadrant of left female breast: Secondary | ICD-10-CM

## 2018-04-21 DIAGNOSIS — R918 Other nonspecific abnormal finding of lung field: Secondary | ICD-10-CM | POA: Diagnosis not present

## 2018-04-21 DIAGNOSIS — K219 Gastro-esophageal reflux disease without esophagitis: Secondary | ICD-10-CM | POA: Insufficient documentation

## 2018-04-21 DIAGNOSIS — Z853 Personal history of malignant neoplasm of breast: Secondary | ICD-10-CM | POA: Diagnosis not present

## 2018-04-21 DIAGNOSIS — N644 Mastodynia: Secondary | ICD-10-CM | POA: Diagnosis not present

## 2018-04-21 DIAGNOSIS — Z79899 Other long term (current) drug therapy: Secondary | ICD-10-CM | POA: Diagnosis not present

## 2018-04-21 NOTE — Progress Notes (Signed)
Radiation Oncology         (336) [phone] ________________________________  Name: Carolyn Garcia MRN: [id number]  Date: 04/21/2018  DOB: 1966/03/30  Follow-Up Visit Note  CC: Jani Gravel, MD  Tommy Medal, MD    ICD-10-CM   1. 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  Narrative:  The patient returns today for routine follow-up of radiation completed 05/12/2003. She states she is still being followed by Dr. Wilburn Cornelia or hoarseness issues. The patient's recently been started on the proton pump inhibitor to see if this will help this issue.  She continues to have pain in bilateral breasts. She adds her right breast is worse overall, especially the other day, but adds it has since improved. Since her last visit here she has been dealing with acid reflux. She would like a CT chest to verify that the pulmonary nodule has not grown since the last time. She denies nipple discharge, bilateral arm swelling and any other symptoms or complaints at this time.                        ALLERGIES:  is allergic to entex and tyloxapol.  Meds: Current Outpatient Medications  Medication Sig Dispense Refill  . ALPRAZolam (XANAX) 0.25 MG tablet TAKE 1 TABLET BY MOUTH EVERY 6 HOURS AS NEEDED FOR ANXIETY.  Called in to [facility] on [address] per Dr. Sondra Come.    . Biotin 5 MG TBDP Take 1 tablet by mouth daily.    . calcium citrate-vitamin D 200-200 MG-UNIT TABS Take 2 tablets by mouth daily. Reported on 04/25/2016    . cholecalciferol (VITAMIN D) 1000 UNITS tablet Take 2,000 Units by mouth daily.    . cyclobenzaprine (FLEXERIL) 10 MG tablet Take 10 mg by mouth daily as needed.     . fluticasone (FLONASE) 50 MCG/ACT nasal spray  2 sprays by Each Nare route nightly for 30 days.    . Golimumab (SIMPONI) 50 MG/0.5ML SOLN Inject 50 mg into the skin every 30 (thirty) days.    Marland Kitchen HYDROcodone-acetaminophen (NORCO/VICODIN) 5-325 MG tablet Take 1-2 tablets by mouth every 6 (six) hours as needed for moderate pain. Take 1-2 tablets by mouth every 4-6 hours as needed for pain 360 tablet 0  . levocetirizine (XYZAL) 5 MG tablet   5  . levothyroxine (SYNTHROID, LEVOTHROID) 75 MCG tablet Take 75 mcg by mouth daily before breakfast.    . LIDODERM 5 % Place onto the skin as needed.    . montelukast (SINGULAIR) 10 MG tablet     . omeprazole (PRILOSEC) 40 MG capsule Take by mouth.    . benzonatate (TESSALON) 200 MG capsule Take 200 mg by mouth 3 (three) times daily as needed.    . diclofenac sodium (VOLTAREN) 1 % GEL APP EXT AA Q 6 H UTD PRN  2  . hyoscyamine (ANASPAZ) 0.125 MG TBDP disintergrating tablet Place 0.125 mg under the tongue every 6 (six) hours as needed.    . Multiple Vitamin (MULTIVITAMIN) tablet Take 1 tablet by mouth daily.     No current facility-administered medications for this encounter.     Physical Findings: The patient is in no acute distress. Patient is alert and oriented.  weight is  122 lb 6 oz (55.5 kg). Her oral temperature is 98.4 F (36.9 C). Her blood pressure is 111/86 and her pulse is 99. Her respiration is 18 and oxygen saturation is 100%. .  No significant changes.  Lungs are clear to auscultation bilaterally. Heart has regular rate and rhythm. No palpable cervical, supraclavicular, or axillary adenopathy. Abdomen soft, non-tender, normal bowel sounds.  Left breast with no palpable mass or nipple discharge. Patient has somewhat of an indention at her lumpectomy scar which has been noted on previous exams.  Right breast with no palpable mass, nipple discharge or bleeding. Both breasts are soft. Patient continues to be tender with palpation throughout both breasts.    Lab Findings: Lab Results    Component Value Date   WBC 4.2 04/13/2015   HGB 12.6 04/13/2015   HCT 38.4 04/13/2015   MCV 93.1 04/13/2015   PLT 220 04/13/2015    Radiographic Findings: No results found.  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 No evidence of recurrence on clinical exam.   Plan:  Patient wishes to continue follow-up every 6 months due to her level of anxiety. The patient also wishes to proceed with additional CT scan to confirm stability of pulmonary nodule.  ____________________________________  Blair Promise, PhD, MD  This document serves as a record of services personally performed by Blair Promise, PhD, MD. It was created on his behalf by Margit Banda, a trained medical scribe. The creation of this record is based on the scribe's personal observations and the provider's statements to them. This document has been checked and approved by the attending provider.

## 2018-04-21 NOTE — Progress Notes (Addendum)
Patient is here for her follow-up appointment. States that she has pain in both breast. States that she has pain under her left arm. States that the pain is a 6/10. States that she has serve fatigue.States that she has burning internally in the breast area.Denies any skin discoloration. Vitals:   04/21/18 1600  BP: 111/86  Pulse: 99  Resp: 18  Temp: 98.4 F (36.9 C)  TempSrc: Oral  SpO2: 100%  Weight: 122 lb 6 oz (55.5 kg)   Wt Readings from Last 3 Encounters:  04/21/18 122 lb 6 oz (55.5 kg)  10/21/17 126 lb (57.2 kg)  06/10/17 124 lb 12.8 oz (56.6 kg)

## 2018-04-23 ENCOUNTER — Telehealth: Payer: Self-pay | Admitting: *Deleted

## 2018-04-23 NOTE — Telephone Encounter (Signed)
CALLED PATIENT TO INFORM OF FU APPT. IN OCT. WITH DR. KINARD AND TO ASK HER A QUESTION, LVM FOR A RETURN CALL

## 2018-04-24 ENCOUNTER — Telehealth: Payer: Self-pay | Admitting: *Deleted

## 2018-04-24 NOTE — Telephone Encounter (Signed)
CALLED PATIENT TO INFORM OF CT FOR 04-30-18 - ARRIVAL TIME - 3:15 PM @ [facility], NO RESTRICTIONS TO TEST, SPOKE WITH PATIENT AND SHE IS AWARE OF THIS TEST

## 2018-04-30 ENCOUNTER — Ambulatory Visit (HOSPITAL_COMMUNITY)
Admission: RE | Admit: 2018-04-30 | Discharge: 2018-04-30 | Disposition: A | Discharge status: Home / Self Care | Payer: Medicare Other | Source: Ambulatory Visit | Attending: Radiation Oncology | Admitting: Radiation Oncology

## 2018-04-30 DIAGNOSIS — R918 Other nonspecific abnormal finding of lung field: Secondary | ICD-10-CM | POA: Insufficient documentation

## 2018-04-30 DIAGNOSIS — J439 Emphysema, unspecified: Secondary | ICD-10-CM | POA: Diagnosis not present

## 2018-05-01 ENCOUNTER — Telehealth: Payer: Self-pay

## 2018-05-01 NOTE — Telephone Encounter (Signed)
Called pt to inform her, per Dr. Sondra Come, of the good findings from recent CT scan. Confirmed f/u appt on 10/06/18 and instructed pt to call back should she have any other questions/concerns. Pt verbalized understanding and agreement.

## 2018-07-03 ENCOUNTER — Other Ambulatory Visit: Payer: Self-pay | Admitting: Radiation Oncology

## 2018-07-03 MED ORDER — HYDROCODONE-ACETAMINOPHEN 5-325 MG PO TABS: 1.0000 | ORAL_TABLET | Freq: Four times a day (QID) | ORAL | 0 refills | Status: DC | PRN

## 2018-07-04 ENCOUNTER — Telehealth: Payer: Self-pay

## 2018-07-04 NOTE — Telephone Encounter (Signed)
Left VM for pt that script for hydrocodone/acetaminophen was ready in Radiation Oncology for pick-up. This RN's direct number left on VM for any questions/concerns. Loma Sousa, RN BSN    Script in envelope at nurses' station with pt's name and DOB. Loma Sousa, RN BSN

## 2018-10-06 ENCOUNTER — Ambulatory Visit
Admission: RE | Admit: 2018-10-06 | Discharge: 2018-10-06 | Disposition: A | Discharge status: Home / Self Care | Payer: Medicare Other | Source: Ambulatory Visit | Attending: Radiation Oncology | Admitting: Radiation Oncology

## 2018-10-06 ENCOUNTER — Encounter: Payer: Self-pay | Admitting: Radiation Oncology

## 2018-10-06 ENCOUNTER — Other Ambulatory Visit: Payer: Self-pay

## 2018-10-06 VITALS — BP 110/87 | HR 90 | Temp 98.2°F | Resp 18 | Wt 120.2 lb

## 2018-10-06 DIAGNOSIS — Z853 Personal history of malignant neoplasm of breast: Secondary | ICD-10-CM | POA: Insufficient documentation

## 2018-10-06 DIAGNOSIS — C50412 Malignant neoplasm of upper-outer quadrant of left female breast: Secondary | ICD-10-CM

## 2018-10-06 DIAGNOSIS — Z08 Encounter for follow-up examination after completed treatment for malignant neoplasm: Secondary | ICD-10-CM | POA: Insufficient documentation

## 2018-10-06 DIAGNOSIS — N644 Mastodynia: Secondary | ICD-10-CM | POA: Insufficient documentation

## 2018-10-06 DIAGNOSIS — Z79899 Other long term (current) drug therapy: Secondary | ICD-10-CM | POA: Insufficient documentation

## 2018-10-06 DIAGNOSIS — Z923 Personal history of irradiation: Secondary | ICD-10-CM | POA: Diagnosis not present

## 2018-10-06 DIAGNOSIS — C50912 Malignant neoplasm of unspecified site of left female breast: Secondary | ICD-10-CM | POA: Diagnosis present

## 2018-10-06 DIAGNOSIS — G8929 Other chronic pain: Secondary | ICD-10-CM | POA: Insufficient documentation

## 2018-10-06 MED ORDER — HYDROCODONE-ACETAMINOPHEN 5-325 MG PO TABS: 1.0000 | ORAL_TABLET | Freq: Four times a day (QID) | ORAL | 0 refills | Status: DC | PRN

## 2018-10-06 NOTE — Progress Notes (Signed)
Radiation Oncology         [phone] ________________________________  Name: Carolyn Garcia MRN: [id number]  Date: 10/06/2018  DOB: January 28, 1966  Follow-Up Visit Note  CC: Jani Gravel, MD  Tommy Medal, MD    ICD-10-CM   1. 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  Narrative:  The patient returns today for routine follow-up of radiation completed 05/12/2003.   Follow up chest CT scan performed on 04/30/18 showed stable 5 mm nodule in left upper lobe, unchanged since 2010, benign.  She continues to have pain in bilateral breasts and under her left arm. She notes the pain as throbbing today. She states moderate fatigue, noting she didn't sleep well last night. She denies nipple discharge or bleeding and any skin issues. She notes a "puffiness" to the left axillary region.   ALLERGIES:  is allergic to entex and tyloxapol.  Meds: Current Outpatient Medications  Medication Sig Dispense Refill  . ALPRAZolam (XANAX) 0.25 MG tablet TAKE 1 TABLET BY MOUTH EVERY 6 HOURS AS NEEDED FOR ANXIETY.  Called in to [facility] on [address] per Dr. Sondra Come.    . Biotin 5 MG TBDP Take 1 tablet by mouth daily.    . cholecalciferol (VITAMIN D) 1000 UNITS tablet Take 2,000 Units by mouth daily.    . cyclobenzaprine (FLEXERIL) 10 MG tablet Take 10 mg by mouth daily as needed.     . fluticasone (FLONASE) 50 MCG/ACT nasal spray 2 sprays by Each Nare route nightly for 30 days.    . Golimumab (SIMPONI) 50 MG/0.5ML SOLN Inject 50 mg into the skin every 30 (thirty) days.    Marland Kitchen HYDROcodone-acetaminophen (NORCO/VICODIN) 5-325 MG tablet Take 1-2 tablets by mouth every 6 (six) hours as needed for moderate pain. Take 1-2 tablets by  mouth every 4-6 hours as needed for pain 360 tablet 0  . levocetirizine (XYZAL) 5 MG tablet   5  . levothyroxine (SYNTHROID, LEVOTHROID) 75 MCG tablet Take 75 mcg by mouth daily before breakfast.    . montelukast (SINGULAIR) 10 MG tablet     . benzonatate (TESSALON) 200 MG capsule Take 200 mg by mouth 3 (three) times daily as needed.    . calcium citrate-vitamin D 200-200 MG-UNIT TABS Take 2 tablets by mouth daily. Reported on 04/25/2016    . diclofenac sodium (VOLTAREN) 1 % GEL APP EXT AA Q 6 H UTD PRN  2  . hyoscyamine (ANASPAZ) 0.125 MG TBDP disintergrating tablet Place 0.125 mg under the tongue every 6 (six) hours as needed.    Marland Kitchen LIDODERM 5 % Place onto the skin as needed.    . Multiple Vitamin (MULTIVITAMIN) tablet Take 1 tablet by mouth daily.    Marland Kitchen omeprazole (PRILOSEC) 40 MG capsule Take by mouth.     No current facility-administered medications for this encounter.     Physical Findings: The patient is in no acute distress. Patient is alert and oriented.  weight is 120 lb 3.2 oz (54.5 kg). Her temperature is 98.2 F (36.8 C). Her blood pressure is 110/87 and her pulse is 90. Her respiration is 18 and oxygen saturation is 100%.  Lungs are clear to auscultation bilaterally. Heart has regular rate and rhythm. No palpable cervical, supraclavicular, or axillary adenopathy. Abdomen  soft, non-tender, normal bowel sounds. Left breast with no palpable mass or nipple discharge. Patient has somewhat of an indention at her lumpectomy scar which has been noted on previous exams.  Right breast with no palpable mass, nipple discharge or bleeding. Both breasts are soft. Patient continues to be tender with palpation throughout both breasts.   Lab Findings: Lab Results  Component Value Date   WBC 4.2 04/13/2015   HGB 12.6 04/13/2015   HCT 38.4 04/13/2015   MCV 93.1 04/13/2015   PLT 220 04/13/2015    Radiographic Findings: No results found.  Impression:  Stage IIA poorly differentiated invasive  ductal carcinoma of the left breast.  No evidence of recurrence on clinical exam.   Plan:  Patient will proceed with breast imaging in December. Routine follow up in radiation oncology in 6 months. Refilled her pain medication in light of her chronic pain in the breast region.  ____________________________________  Blair Promise, PhD, MD  This document serves as a record of services personally performed by Gery Pray, MD. It was created on his behalf by Wilburn Mylar, a trained medical scribe. The creation of this record is based on the scribe's personal observations and the provider's statements to them. This document has been checked and approved by the attending provider.

## 2018-10-06 NOTE — Progress Notes (Signed)
Carolyn Garcia is here for her follow-up appointment today. Patient states that she is having pain in both breast and under her left arm. Patient states that she is having moderate fatigue. Denies any issues with her skin.  Vitals:   10/06/18 1602  BP: 110/87  Pulse: 90  Resp: 18  Temp: 98.2 F (36.8 C)  SpO2: 100%  Weight: 120 lb 3.2 oz (54.5 kg)   Wt Readings from Last 3 Encounters:  10/06/18 120 lb 3.2 oz (54.5 kg)  04/21/18 122 lb 6 oz (55.5 kg)  10/21/17 126 lb (57.2 kg)

## 2018-10-07 ENCOUNTER — Other Ambulatory Visit: Payer: Self-pay | Admitting: Radiation Oncology

## 2018-10-07 MED ORDER — HYDROCODONE-ACETAMINOPHEN 5-325 MG PO TABS: 1.0000 | ORAL_TABLET | ORAL | 0 refills | Status: DC | PRN

## 2018-10-20 ENCOUNTER — Ambulatory Visit: Payer: Self-pay | Admitting: Radiation Oncology

## 2018-11-10 ENCOUNTER — Other Ambulatory Visit: Payer: Self-pay | Admitting: Radiation Oncology

## 2018-11-10 DIAGNOSIS — Z1231 Encounter for screening mammogram for malignant neoplasm of breast: Secondary | ICD-10-CM

## 2018-12-08 ENCOUNTER — Ambulatory Visit
Admission: RE | Admit: 2018-12-08 | Discharge: 2018-12-08 | Disposition: A | Discharge status: Home / Self Care | Payer: Medicare Other | Source: Ambulatory Visit | Attending: Radiation Oncology | Admitting: Radiation Oncology

## 2018-12-08 DIAGNOSIS — Z1231 Encounter for screening mammogram for malignant neoplasm of breast: Secondary | ICD-10-CM

## 2018-12-25 ENCOUNTER — Ambulatory Visit: Payer: Medicare Other

## 2018-12-31 ENCOUNTER — Other Ambulatory Visit: Payer: Self-pay | Admitting: Radiation Oncology

## 2018-12-31 MED ORDER — HYDROCODONE-ACETAMINOPHEN 5-325 MG PO TABS: 1.0000 | ORAL_TABLET | ORAL | 0 refills | Status: DC | PRN

## 2019-03-26 ENCOUNTER — Other Ambulatory Visit: Payer: Self-pay | Admitting: Radiation Oncology

## 2019-03-26 ENCOUNTER — Telehealth: Payer: Self-pay | Admitting: *Deleted

## 2019-03-26 MED ORDER — HYDROCODONE-ACETAMINOPHEN 5-325 MG PO TABS: 1.0000 | ORAL_TABLET | ORAL | 0 refills | Status: DC | PRN

## 2019-03-26 NOTE — Telephone Encounter (Signed)
CALLED PATIENT TO ALTER FU ON 04-06-19 DUE TO COVID 19, RESCHEDULED FOR 07-06-19 @ 4 PM, LVM FOR A RETURN CALL

## 2019-04-06 ENCOUNTER — Ambulatory Visit: Payer: Self-pay | Admitting: Radiation Oncology

## 2019-05-27 ENCOUNTER — Other Ambulatory Visit: Payer: Self-pay | Admitting: Internal Medicine

## 2019-05-27 DIAGNOSIS — M549 Dorsalgia, unspecified: Secondary | ICD-10-CM

## 2019-06-08 ENCOUNTER — Ambulatory Visit
Admission: RE | Admit: 2019-06-08 | Discharge: 2019-06-08 | Disposition: A | Discharge status: Home / Self Care | Payer: Medicare Other | Source: Ambulatory Visit | Attending: Internal Medicine | Admitting: Internal Medicine

## 2019-06-08 DIAGNOSIS — M549 Dorsalgia, unspecified: Secondary | ICD-10-CM

## 2019-07-06 ENCOUNTER — Ambulatory Visit
Admission: RE | Admit: 2019-07-06 | Discharge: 2019-07-06 | Disposition: A | Discharge status: Home / Self Care | Payer: Medicare Other | Source: Ambulatory Visit | Attending: Radiation Oncology | Admitting: Radiation Oncology

## 2019-07-06 ENCOUNTER — Other Ambulatory Visit: Payer: Self-pay

## 2019-07-06 ENCOUNTER — Encounter: Payer: Self-pay | Admitting: Radiation Oncology

## 2019-07-06 VITALS — BP 135/97 | HR 98 | Temp 98.4°F | Resp 20 | Ht 65.0 in | Wt 119.4 lb

## 2019-07-06 DIAGNOSIS — G8929 Other chronic pain: Secondary | ICD-10-CM | POA: Diagnosis not present

## 2019-07-06 DIAGNOSIS — M79621 Pain in right upper arm: Secondary | ICD-10-CM | POA: Diagnosis not present

## 2019-07-06 DIAGNOSIS — C50412 Malignant neoplasm of upper-outer quadrant of left female breast: Secondary | ICD-10-CM

## 2019-07-06 DIAGNOSIS — M79622 Pain in left upper arm: Secondary | ICD-10-CM | POA: Diagnosis not present

## 2019-07-06 DIAGNOSIS — Z853 Personal history of malignant neoplasm of breast: Secondary | ICD-10-CM | POA: Insufficient documentation

## 2019-07-06 DIAGNOSIS — Z79899 Other long term (current) drug therapy: Secondary | ICD-10-CM | POA: Insufficient documentation

## 2019-07-06 DIAGNOSIS — N644 Mastodynia: Secondary | ICD-10-CM | POA: Insufficient documentation

## 2019-07-06 DIAGNOSIS — Z923 Personal history of irradiation: Secondary | ICD-10-CM | POA: Insufficient documentation

## 2019-07-06 DIAGNOSIS — M5124 Other intervertebral disc displacement, thoracic region: Secondary | ICD-10-CM | POA: Insufficient documentation

## 2019-07-06 HISTORY — DX: Age-related osteoporosis without current pathological fracture: M81.0

## 2019-07-06 MED ORDER — HYDROCODONE-ACETAMINOPHEN 5-325 MG PO TABS: 1.0000 | ORAL_TABLET | ORAL | 0 refills | Status: DC | PRN

## 2019-07-06 NOTE — Patient Instructions (Signed)
Coronavirus (COVID-19) Are you at risk?  Are you at risk for the Coronavirus (COVID-19)?  To be considered HIGH RISK for Coronavirus (COVID-19), you have to meet the following criteria:  . Traveled to [place], [place], [place], [place] or [place]; or in the [place] to [place], [place], [place], or [place]; and have fever, cough, and shortness of breath within the last 2 weeks of travel OR . Been in close contact with a person diagnosed with COVID-19 within the last 2 weeks and have fever, cough, and shortness of breath . IF YOU DO NOT MEET THESE CRITERIA, YOU ARE CONSIDERED LOW RISK FOR COVID-19.  What to do if you are HIGH RISK for COVID-19?  . If you are having a medical emergency, call 911. . Seek medical care right away. Before you go to a doctor's office, urgent care or emergency department, call ahead and tell them about your recent travel, contact with someone diagnosed with COVID-19, and your symptoms. You should receive instructions from your physician's office regarding next steps of care.  . When you arrive at healthcare provider, tell the healthcare staff immediately you have returned from visiting [place], [place], [place], [place] or [place]; or traveled in the [place] to [place], [place], [place], or [place]; in the last two weeks or you have been in close contact with a person diagnosed with COVID-19 in the last 2 weeks.   . Tell the health care staff about your symptoms: fever, cough and shortness of breath. . After you have been seen by a medical provider, you will be either: o Tested for (COVID-19) and discharged home on quarantine except to seek medical care if symptoms worsen, and asked to  - Stay home and avoid contact with others until you get your results (4-5 days)  - Avoid travel on public transportation if possible (such as bus, train, or airplane) or o Sent to the Emergency Department by EMS for evaluation, COVID-19 testing, and possible  admission depending on your condition and test results.  What to do if you are LOW RISK for COVID-19?  Reduce your risk of any infection by using the same precautions used for avoiding the common cold or flu:  . Wash your hands often with soap and warm water for at least 20 seconds.  If soap and water are not readily available, use an alcohol-based hand sanitizer with at least 60% alcohol.  . If coughing or sneezing, cover your mouth and nose by coughing or sneezing into the elbow areas of your shirt or coat, into a tissue or into your sleeve (not your hands). . Avoid shaking hands with others and consider head nods or verbal greetings only. . Avoid touching your eyes, nose, or mouth with unwashed hands.  . Avoid close contact with people who are sick. . Avoid places or events with large numbers of people in one location, like concerts or sporting events. . Carefully consider travel plans you have or are making. . If you are planning any travel outside or inside the [place], visit the CDC's Travelers' Health webpage for the latest health notices. . If you have some symptoms but not all symptoms, continue to monitor at home and seek medical attention if your symptoms worsen. . If you are having a medical emergency, call 911.   ADDITIONAL HEALTHCARE OPTIONS FOR PATIENTS  [facility] / e-Visit: [email or web address]         [facility]: [phone]  [facility]: [phone]                   [facility]: [phone]   

## 2019-07-06 NOTE — Progress Notes (Signed)
Pt presents today for f/u with Dr. Sondra Come. Pt reports chronic pain and burning in bilateral axillas, upper chest, and bilateral breasts, reports pain currently 6/10 and worsens dramatically with manipulation.   BP (!) 135/97 (BP Location: Right Arm, Patient Position: Sitting)   Pulse 98   Temp 98.4 F (36.9 C) (Oral)   Resp 20   Ht 5\' 5"  (1.651 m)   Wt 119 lb 6.4 oz (54.2 kg)   SpO2 100%   BMI 19.87 kg/m   Wt Readings from Last 3 Encounters:  07/06/19 119 lb 6.4 oz (54.2 kg)  10/06/18 120 lb 3.2 oz (54.5 kg)  04/21/18 122 lb 6 oz (55.5 kg)   Loma Sousa, RN BSN

## 2019-07-06 NOTE — Progress Notes (Signed)
Radiation Oncology         [phone] ________________________________  Name: Carolyn Garcia MRN: [id number]  Date: 07/06/2019  DOB: [birth year]  Follow-Up Visit Note  CC: Jani Gravel, MD  Tommy Medal, MD    ICD-10-CM   1. 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  Narrative:  The patient returns today for follow-up of radiation completed 05/12/2003.   Bilateral mammogram performed 12/08/18 did not reveal any evidence of malignancy.   She had an MRI of her thoracic spine without contrast on 06/08/19 post lifting injury/fall and back pain. The scan revealed focal endplate deformity of T7 superior endplate depression associated with a T6-T7 disc herniation into the vertebral body, consistent with an acute/subacute Schmorl's node. Surrounding bone marrow edema within T7 suggests that this lesion could explain midthoracic pain. This finding was not present on prior CT chest. No concerning features for metastatic breast cancer or a compressive thoracic disc protrusion.  Pt reports chronic pain and burning in bilateral axillas, upper chest, and bilateral breasts, reports pain currently 6/10 and worsens dramatically with manipulation.  Patient delayed initiation of her back pain probably due to the COVID-19 epidemic.  She reports having a sharp pain after lifting her approximately 50 pound dog.  She denies any focal weakness in her lower extremities since the incident.  She does report pain in the lower anterior chest and bilateral breast area is been more significant since this incident.   ALLERGIES:  is allergic to entex; tyloxapol; venlafaxine; and phenylephrine-guaifenesin.  Meds: Current Outpatient  Medications  Medication Sig Dispense Refill   ALPRAZolam (XANAX) 0.25 MG tablet TAKE 1 TABLET BY MOUTH EVERY 6 HOURS AS NEEDED FOR ANXIETY.  Called in to [facility] on [address] per Dr. Sondra Come.     benzonatate (TESSALON) 200 MG capsule Take 200 mg by mouth 3 (three) times daily as needed.     Biotin 5 MG TBDP Take 1 tablet by mouth daily.     cholecalciferol (VITAMIN D) 1000 UNITS tablet Take 2,000 Units by mouth daily.     cyclobenzaprine (FLEXERIL) 10 MG tablet Take 10 mg by mouth daily as needed.      Golimumab (SIMPONI) 50 MG/0.5ML SOLN Inject 50 mg into the skin every 30 (thirty) days.     HYDROcodone-acetaminophen (NORCO/VICODIN) 5-325 MG tablet Take 1-2 tablets by mouth as needed for moderate pain (Q4-6 hrs). 360 tablet 0   levocetirizine (XYZAL) 5 MG tablet   5   levothyroxine (SYNTHROID, LEVOTHROID) 75 MCG tablet Take 75 mcg by mouth daily before breakfast.     montelukast (SINGULAIR) 10 MG tablet      calcium citrate-vitamin D 200-200 MG-UNIT TABS Take 2 tablets by mouth daily. Reported on 04/25/2016     diclofenac sodium (VOLTAREN) 1 % GEL APP EXT AA Q 6 H UTD PRN  2   fluticasone (FLONASE) 50 MCG/ACT nasal spray 2 sprays by Each Nare route nightly for 30 days.     hyoscyamine (ANASPAZ) 0.125 MG TBDP disintergrating tablet Place 0.125 mg under the tongue every 6 (six) hours as needed.     LIDODERM 5 % Place onto the skin as needed.     Multiple Vitamin (MULTIVITAMIN) tablet Take 1 tablet by mouth daily.  No current facility-administered medications for this encounter.     Physical Findings: The patient is in no acute distress. Patient is alert and oriented.  height is 5\' 5"  (1.651 m) and weight is 119 lb 6.4 oz (54.2 kg). Her oral temperature is 98.4 F (36.9 C). Her blood pressure is 135/97 (abnormal) and her pulse is 98. Her respiration is 20 and oxygen saturation is 100%.  Lungs are clear to auscultation bilaterally. Heart has regular rate and rhythm. No  palpable cervical, supraclavicular, or axillary adenopathy. Abdomen soft, non-tender, normal bowel sounds. Left breast with no palpable mass or nipple discharge. Patient has somewhat of an indention at her lumpectomy scar which has been noted on previous exams. Right breast with no palpable mass, nipple discharge or bleeding. Both breasts are soft. Patient continues to be tender with palpation throughout both breasts.  Slight swelling in the right axillary area but no palpable lymphadenopathy   Lab Findings: Lab Results  Component Value Date   WBC 4.2 04/13/2015   HGB 12.6 04/13/2015   HCT 38.4 04/13/2015   MCV 93.1 04/13/2015   PLT 220 04/13/2015    Radiographic Findings: Mr Thoracic Spine Wo Contrast  Result Date: 06/09/2019 CLINICAL DATA:  Mid back pain. Lifting injury. Fell. Breast cancer. EXAM: MRI THORACIC SPINE WITHOUT CONTRAST TECHNIQUE: Multiplanar, multisequence MR imaging of the thoracic spine was performed. No intravenous contrast was administered. COMPARISON:  CT of the chest 04/30/2018. FINDINGS: Alignment:  Anatomic Vertebrae: There is a focal endplate deformity of T7, superior endplate depression, associated with a disc herniation into the vertebral body, and surrounding bone marrow edema. Findings are consistent with an acute/subacute Schmorl's node. This abnormality was not present on the previous CT, and would have been seen if present. There are no concerning features for metastatic breast cancer. There is an incidental hemangioma in T5. Cord:  Normal signal and morphology. Paraspinal and other soft tissues: Axial images are motion degraded, but no paravertebral or pulmonary pathology is evident. Disc levels: No compressive disc protrusion or stenosis. IMPRESSION: Focal endplate deformity of T7 superior endplate depression associated with a T6-T7 disc herniation into the vertebral body, consistent with an acute/subacute Schmorl's node. Surrounding bone marrow edema within T7  suggests that this lesion could explain midthoracic pain. This finding was not present on prior CT chest. No concerning features for metastatic breast cancer or a compressive thoracic disc protrusion. Electronically Signed   By: Staci Righter M.D.   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  No evidence of recurrence on clinical exam.  Back pain related to injury as above.  The patient is scheduled to see Dr. Nelva Bush for further evaluation of this issue  Plan:  Patient will proceed with breast imaging in December. Routine follow up in radiation oncology in 6 months. Refilled her pain medication in light of her chronic pain in the breast region.  ____________________________________  Blair Promise, PhD, MD  This document serves as a record of services personally performed by Gery Pray, MD. It was created on his behalf by Mary-Margaret Loma Messing, a trained medical scribe. The creation of this record is based on the scribe's personal observations and the provider's statements to them. This document has been checked and approved by the attending provider.

## 2019-09-29 ENCOUNTER — Other Ambulatory Visit: Payer: Self-pay | Admitting: Radiation Oncology

## 2019-09-29 MED ORDER — HYDROCODONE-ACETAMINOPHEN 5-325 MG PO TABS: 1.0000 | ORAL_TABLET | ORAL | 0 refills | Status: DC | PRN

## 2019-10-30 ENCOUNTER — Other Ambulatory Visit: Payer: Self-pay | Admitting: Radiation Oncology

## 2019-10-30 DIAGNOSIS — Z1231 Encounter for screening mammogram for malignant neoplasm of breast: Secondary | ICD-10-CM

## 2019-12-23 ENCOUNTER — Ambulatory Visit
Admission: RE | Admit: 2019-12-23 | Discharge: 2019-12-23 | Disposition: A | Discharge status: Home / Self Care | Payer: Medicare Other | Source: Ambulatory Visit | Attending: Radiation Oncology | Admitting: Radiation Oncology

## 2019-12-23 ENCOUNTER — Other Ambulatory Visit: Payer: Self-pay

## 2019-12-23 DIAGNOSIS — Z1231 Encounter for screening mammogram for malignant neoplasm of breast: Secondary | ICD-10-CM

## 2019-12-31 ENCOUNTER — Other Ambulatory Visit: Payer: Self-pay | Admitting: Radiation Oncology

## 2019-12-31 MED ORDER — HYDROCODONE-ACETAMINOPHEN 5-325 MG PO TABS: 1.0000 | ORAL_TABLET | ORAL | 0 refills | Status: DC | PRN

## 2020-01-04 ENCOUNTER — Other Ambulatory Visit: Payer: Self-pay

## 2020-01-04 ENCOUNTER — Encounter: Payer: Self-pay | Admitting: Radiation Oncology

## 2020-01-04 ENCOUNTER — Ambulatory Visit
Admission: RE | Admit: 2020-01-04 | Discharge: 2020-01-04 | Disposition: A | Discharge status: Home / Self Care | Payer: Medicare Other | Source: Ambulatory Visit | Attending: Radiation Oncology | Admitting: Radiation Oncology

## 2020-01-04 VITALS — BP 115/95 | HR 98 | Temp 97.2°F | Resp 18 | Ht 66.0 in | Wt 121.0 lb

## 2020-01-04 DIAGNOSIS — Z923 Personal history of irradiation: Secondary | ICD-10-CM | POA: Insufficient documentation

## 2020-01-04 DIAGNOSIS — Z853 Personal history of malignant neoplasm of breast: Secondary | ICD-10-CM | POA: Insufficient documentation

## 2020-01-04 DIAGNOSIS — Z79899 Other long term (current) drug therapy: Secondary | ICD-10-CM | POA: Diagnosis not present

## 2020-01-04 DIAGNOSIS — C50412 Malignant neoplasm of upper-outer quadrant of left female breast: Secondary | ICD-10-CM

## 2020-01-04 DIAGNOSIS — N644 Mastodynia: Secondary | ICD-10-CM | POA: Diagnosis not present

## 2020-01-04 NOTE — Progress Notes (Signed)
Pt presents today for f/u with Dr. Sondra Come for mammogram results. Pt reports increase in pain after mammogram, bilateral breasts and left axilla.   BP (!) 115/95 (BP Location: Right Arm, Patient Position: Sitting)   Pulse 98   Temp (!) 97.2 F (36.2 C) (Temporal)   Resp 18   Ht 5\' 6"  (1.676 m)   Wt 121 lb (54.9 kg)   SpO2 100%   BMI 19.53 kg/m   Wt Readings from Last 3 Encounters:  01/04/20 121 lb (54.9 kg)  07/06/19 119 lb 6.4 oz (54.2 kg)  10/06/18 120 lb 3.2 oz (54.5 kg)   Loma Sousa, RN BSN

## 2020-01-04 NOTE — Progress Notes (Signed)
Radiation Oncology         [phone] ________________________________  Name: Carolyn Garcia MRN: [id number]  Date: 01/04/2020  DOB: Jan 22, 1966  Follow-Up Visit Note  CC: Jani Gravel, MD  Tommy Medal, MD    ICD-10-CM   1. 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  03/29/2003-05/12/2003: Adjuvant radiation therapy to the left breast.  Narrative: The patient returns today for follow-up of radiation completed 05/12/2003.   Since last visit, patient had a routine screening mammogram on 12/23/2019, which showed no mammographic evidence of malignancy. No other significant interval history.  On review of systems, she reports continued chronic pain in both breast, more so along the left side.  Continues on hydrocodone for her breast pain. she denies nipple discharge or bleeding.   ALLERGIES:  is allergic to entex; tyloxapol; venlafaxine; and phenylephrine-guaifenesin.  Meds: Current Outpatient Medications  Medication Sig Dispense Refill  . ALPRAZolam (XANAX) 0.25 MG tablet TAKE 1 TABLET BY MOUTH EVERY 6 HOURS AS NEEDED FOR ANXIETY.  Called in to [facility] on [address] per Dr. Sondra Come.    . benzonatate (TESSALON) 200 MG capsule Take 200 mg by mouth 3 (three) times daily as needed.    . Biotin 5 MG TBDP Take 1 tablet by mouth daily.    . calcium citrate-vitamin D 200-200 MG-UNIT TABS Take 2 tablets by mouth daily. Reported on 04/25/2016    . cholecalciferol (VITAMIN D) 1000 UNITS tablet Take 2,000 Units by mouth daily.    . cyclobenzaprine (FLEXERIL) 10 MG tablet Take 10 mg by mouth daily as needed.     . diclofenac sodium (VOLTAREN) 1 % GEL APP EXT AA Q 6 H UTD PRN  2  . fluticasone (FLONASE) 50 MCG/ACT nasal spray 2 sprays by  Each Nare route nightly for 30 days.    . Golimumab (SIMPONI) 50 MG/0.5ML SOLN Inject 50 mg into the skin every 30 (thirty) days.    Marland Kitchen HYDROcodone-acetaminophen (NORCO/VICODIN) 5-325 MG tablet Take 1-2 tablets by mouth as needed for moderate pain (Q4-6 hrs). 360 tablet 0  . hyoscyamine (ANASPAZ) 0.125 MG TBDP disintergrating tablet Place 0.125 mg under the tongue every 6 (six) hours as needed.    Marland Kitchen levocetirizine (XYZAL) 5 MG tablet   5  . levothyroxine (SYNTHROID, LEVOTHROID) 75 MCG tablet Take 75 mcg by mouth daily before breakfast.    . LIDODERM 5 % Place onto the skin as needed.    . montelukast (SINGULAIR) 10 MG tablet     . Multiple Vitamin (MULTIVITAMIN) tablet Take 1 tablet by mouth daily.     No current facility-administered medications for this encounter.    Physical Findings: The patient is in no acute distress. Patient is alert and oriented.  height is 5\' 6"  (1.676 m) and weight is 121 lb (54.9 kg). Her temporal temperature is 97.2 F (36.2 C) (abnormal). Her blood pressure is 115/95 (abnormal) and her pulse is 98. Her respiration is 18 and oxygen saturation is 100%.  Lungs are clear to auscultation bilaterally. Heart has regular rate and rhythm. No palpable cervical, supraclavicular, or axillary adenopathy. Abdomen soft, non-tender, normal bowel sounds. Left breast with no palpable mass or nipple discharge. Patient has somewhat of an indention at her lumpectomy scar which has been noted on previous  exams.  Right breast with no palpable mass, nipple discharge or bleeding.  Both breasts somewhat tender with palpation throughout.  Lab Findings: Lab Results  Component Value Date   WBC 4.2 04/13/2015   HGB 12.6 04/13/2015   HCT 38.4 04/13/2015   MCV 93.1 04/13/2015   PLT 220 04/13/2015    Radiographic Findings: MM 3D SCREEN BREAST BILATERAL  Result Date: 12/23/2019 CLINICAL DATA:  Screening. EXAM: DIGITAL SCREENING BILATERAL MAMMOGRAM WITH TOMO AND CAD COMPARISON:  Previous  exam(s). ACR Breast Density Category b: There are scattered areas of fibroglandular density. FINDINGS: There are no findings suspicious for malignancy. Images were processed with CAD. IMPRESSION: No mammographic evidence of malignancy. A result letter of this screening mammogram will be mailed directly to the patient. RECOMMENDATION: Screening mammogram in one year. (Code:SM-B-01Y) BI-RADS CATEGORY  1: Negative. Electronically Signed   By: Kristopher Oppenheim M.D.   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   No evidence of recurrence on clinical exam.   Plan: Routine follow up in radiation oncology in 6 months.   ____________________________________  Blair Promise, PhD, MD  This document serves as a record of services personally performed by Gery Pray, MD. It was created on his behalf by Clerance Lav, a trained medical scribe. The creation of this record is based on the scribe's personal observations and the provider's statements to them. This document has been checked and approved by the attending provider.

## 2020-01-04 NOTE — Patient Instructions (Signed)
Coronavirus (COVID-19) Are you at risk?  Are you at risk for the Coronavirus (COVID-19)?  To be considered HIGH RISK for Coronavirus (COVID-19), you have to meet the following criteria:  . Traveled to [place], [place], [place], [place] or [place]; or in the [place] to [place], [place], [place], or [place]; and have fever, cough, and shortness of breath within the last 2 weeks of travel OR . Been in close contact with a person diagnosed with COVID-19 within the last 2 weeks and have fever, cough, and shortness of breath . IF YOU DO NOT MEET THESE CRITERIA, YOU ARE CONSIDERED LOW RISK FOR COVID-19.  What to do if you are HIGH RISK for COVID-19?  . If you are having a medical emergency, call 911. . Seek medical care right away. Before you go to a doctor's office, urgent care or emergency department, call ahead and tell them about your recent travel, contact with someone diagnosed with COVID-19, and your symptoms. You should receive instructions from your physician's office regarding next steps of care.  . When you arrive at healthcare provider, tell the healthcare staff immediately you have returned from visiting [place], [place], [place], [place] or [place]; or traveled in the [place] to [place], [place], [place], or [place]; in the last two weeks or you have been in close contact with a person diagnosed with COVID-19 in the last 2 weeks.   . Tell the health care staff about your symptoms: fever, cough and shortness of breath. . After you have been seen by a medical provider, you will be either: o Tested for (COVID-19) and discharged home on quarantine except to seek medical care if symptoms worsen, and asked to  - Stay home and avoid contact with others until you get your results (4-5 days)  - Avoid travel on public transportation if possible (such as bus, train, or airplane) or o Sent to the Emergency Department by EMS for evaluation, COVID-19 testing, and possible  admission depending on your condition and test results.  What to do if you are LOW RISK for COVID-19?  Reduce your risk of any infection by using the same precautions used for avoiding the common cold or flu:  . Wash your hands often with soap and warm water for at least 20 seconds.  If soap and water are not readily available, use an alcohol-based hand sanitizer with at least 60% alcohol.  . If coughing or sneezing, cover your mouth and nose by coughing or sneezing into the elbow areas of your shirt or coat, into a tissue or into your sleeve (not your hands). . Avoid shaking hands with others and consider head nods or verbal greetings only. . Avoid touching your eyes, nose, or mouth with unwashed hands.  . Avoid close contact with people who are sick. . Avoid places or events with large numbers of people in one location, like concerts or sporting events. . Carefully consider travel plans you have or are making. . If you are planning any travel outside or inside the [place], visit the CDC's Travelers' Health webpage for the latest health notices. . If you have some symptoms but not all symptoms, continue to monitor at home and seek medical attention if your symptoms worsen. . If you are having a medical emergency, call 911.   ADDITIONAL HEALTHCARE OPTIONS FOR PATIENTS  [facility] / e-Visit: [email or web address]         [facility]: [phone]  [facility]: [phone]                   [facility]: [phone]   

## 2020-02-26 ENCOUNTER — Telehealth: Payer: Self-pay | Admitting: *Deleted

## 2020-02-26 NOTE — Telephone Encounter (Signed)
CALLED PATIENT TO INFORM OF FU APPT. WITH DR. KINARD ON 03-01-20 @ 3:45 PM, LVM FOR A RETURN CALL

## 2020-02-29 ENCOUNTER — Telehealth: Payer: Self-pay | Admitting: *Deleted

## 2020-02-29 NOTE — Telephone Encounter (Signed)
CALLED PATIENT TO INFORM OF 6 MONTH FU WITH DR. KINARD ON 07-04-20 @ 3:45 PM, LVM FOR A RETURN CALL

## 2020-03-01 ENCOUNTER — Ambulatory Visit: Payer: Self-pay | Admitting: Radiation Oncology

## 2020-03-23 ENCOUNTER — Other Ambulatory Visit: Payer: Self-pay | Admitting: Radiation Oncology

## 2020-03-23 MED ORDER — HYDROCODONE-ACETAMINOPHEN 5-325 MG PO TABS: 1.0000 | ORAL_TABLET | ORAL | 0 refills | Status: DC | PRN

## 2020-05-20 ENCOUNTER — Telehealth: Payer: Self-pay | Admitting: *Deleted

## 2020-05-20 DIAGNOSIS — H52203 Unspecified astigmatism, bilateral: Secondary | ICD-10-CM | POA: Diagnosis not present

## 2020-05-20 DIAGNOSIS — H353121 Nonexudative age-related macular degeneration, left eye, early dry stage: Secondary | ICD-10-CM | POA: Diagnosis not present

## 2020-05-20 NOTE — Telephone Encounter (Signed)
CALLED PATIENT TO INFORM OF FU WITH DR. KINARD ON 05-26-20 @ 4:15 PM, LVM FOR A RETURN CALL

## 2020-05-25 NOTE — Progress Notes (Signed)
Radiation Oncology         [phone] ________________________________  Name: Carolyn Garcia MRN: [id number]  Date: 05/26/2020  DOB: 08/09/1966  Follow-Up Visit Note  CC: Jani Gravel, MD  Tommy Medal, MD    ICD-10-CM   1. Malignant neoplasm of upper-outer quadrant of left female breast, unspecified estrogen receptor status ([place])  C50.412 MM DIAG BREAST TOMO UNI RIGHT  2. Rheumatoid arthritis involving multiple sites, unspecified whether rheumatoid factor present ([place]) Chronic M06.9   3. 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  03/29/2003 - 05/12/2003: Adjuvant radiation therapy to the left breast.  Narrative: The patient returns today for follow-up of radiation completed on 05/12/2003.  The patient requested a earlier follow-up secondary to new onset burning intense pain in the right breast.  This lasts for 10 to 20 seconds and she rates it as 10 out of 10.  Pain is so severe that it causes her to be nauseated.  She continues to have chronic pain in both breasts.  She denies any nipple discharge or bleeding.  She denies Any new lumps or problems with right  arm edema.     ALLERGIES:  is allergic to entex; tyloxapol; venlafaxine; and phenylephrine-guaifenesin.  Meds: Current Outpatient Medications  Medication Sig Dispense Refill  . ALPRAZolam (XANAX) 0.25 MG tablet TAKE 1 TABLET BY MOUTH EVERY 6 HOURS AS NEEDED FOR ANXIETY.  Called in to [facility] on [address] per Dr. Sondra Come.    . Biotin 5 MG TBDP Take 1 tablet by mouth daily.    . cholecalciferol (VITAMIN D) 1000 UNITS tablet Take 2,000 Units by mouth daily.    . fluticasone (FLONASE) 50 MCG/ACT nasal spray 2 sprays by Each Nare route nightly for 30 days.    . Golimumab (SIMPONI) 50 MG/0.5ML SOLN Inject 50 mg into the skin every 30 (thirty) days.     Marland Kitchen HYDROcodone-acetaminophen (NORCO/VICODIN) 5-325 MG tablet Take 1-2 tablets by mouth as needed for moderate pain (Q4-6 hrs). 360 tablet 0  . levocetirizine (XYZAL) 5 MG tablet   5  . levothyroxine (SYNTHROID, LEVOTHROID) 75 MCG tablet Take 75 mcg by mouth daily before breakfast.    . montelukast (SINGULAIR) 10 MG tablet     . benzonatate (TESSALON) 200 MG capsule Take 200 mg by mouth 3 (three) times daily as needed.    . calcium citrate-vitamin D 200-200 MG-UNIT TABS Take 2 tablets by mouth daily. Reported on 04/25/2016    . cyclobenzaprine (FLEXERIL) 10 MG tablet Take 10 mg by mouth daily as needed.     . diclofenac sodium (VOLTAREN) 1 % GEL APP EXT AA Q 6 H UTD PRN  2  . hyoscyamine (ANASPAZ) 0.125 MG TBDP disintergrating tablet Place 0.125 mg under the tongue every 6 (six) hours as needed.    Marland Kitchen LIDODERM 5 % Place onto the skin as needed.    . Multiple Vitamin (MULTIVITAMIN) tablet Take 1 tablet by mouth daily.     No current facility-administered medications for this encounter.    Physical Findings: The patient is in no acute distress. Patient is alert and oriented.  height is 5\' 6"  (1.676 m) and weight is 123 lb (55.8 kg). Her temporal temperature is 97.7 F (36.5 C). Her blood pressure is 118/95 (abnormal) and her pulse is 77. Her respiration is 18 and  oxygen saturation is 100%.  Lungs are clear to auscultation bilaterally. Heart has regular rate and rhythm. No palpable cervical, supraclavicular, or axillary adenopathy. Abdomen soft, non-tender, normal bowel sounds. Left breast with no palpable mass or nipple discharge. Patient has somewhat of an indention at her lumpectomy scar which has been noted on previous exams.  Right breast with no palpable mass, nipple discharge or bleeding.  The patient says her pain originates in the upper central aspect of the breast. Both breasts somewhat tender with palpation throughout.   Lab Findings: Lab Results  Component Value Date   WBC 4.2  04/13/2015   HGB 12.6 04/13/2015   HCT 38.4 04/13/2015   MCV 93.1 04/13/2015   PLT 220 04/13/2015    Radiographic Findings: No results found.  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  New onset severe right breast pain.  Plan: Patient will proceed with a 3D diagnostic right mammogram and ultrasound for further evaluation. If  Breast imaging is unremarkable then may consider ordering a repeat MRI of the thoracic spine as this may possibly be neuropathic  pain.  ____________________________________  Blair Promise, PhD, MD  This document serves as a record of services personally performed by Gery Pray, MD. It was created on his behalf by Clerance Lav, a trained medical scribe. The creation of this record is based on the scribe's personal observations and the provider's statements to them. This document has been checked and approved by the attending provider.

## 2020-05-26 ENCOUNTER — Encounter: Payer: Self-pay | Admitting: Radiation Oncology

## 2020-05-26 ENCOUNTER — Other Ambulatory Visit: Payer: Self-pay

## 2020-05-26 ENCOUNTER — Ambulatory Visit
Admission: RE | Admit: 2020-05-26 | Discharge: 2020-05-26 | Disposition: A | Discharge status: Home / Self Care | Payer: Medicare Other | Source: Ambulatory Visit | Attending: Radiation Oncology | Admitting: Radiation Oncology

## 2020-05-26 VITALS — BP 118/95 | HR 77 | Temp 97.7°F | Resp 18 | Ht 66.0 in | Wt 123.0 lb

## 2020-05-26 DIAGNOSIS — R52 Pain, unspecified: Secondary | ICD-10-CM

## 2020-05-26 DIAGNOSIS — M069 Rheumatoid arthritis, unspecified: Secondary | ICD-10-CM | POA: Diagnosis not present

## 2020-05-26 DIAGNOSIS — G8929 Other chronic pain: Secondary | ICD-10-CM | POA: Insufficient documentation

## 2020-05-26 DIAGNOSIS — Z923 Personal history of irradiation: Secondary | ICD-10-CM | POA: Diagnosis not present

## 2020-05-26 DIAGNOSIS — Z853 Personal history of malignant neoplasm of breast: Secondary | ICD-10-CM | POA: Diagnosis not present

## 2020-05-26 DIAGNOSIS — C50412 Malignant neoplasm of upper-outer quadrant of left female breast: Secondary | ICD-10-CM

## 2020-05-26 DIAGNOSIS — N644 Mastodynia: Secondary | ICD-10-CM | POA: Insufficient documentation

## 2020-05-26 MED ORDER — HYDROCODONE-ACETAMINOPHEN 5-325 MG PO TABS: 1.0000 | ORAL_TABLET | ORAL | 0 refills | Status: DC | PRN

## 2020-05-26 NOTE — Progress Notes (Signed)
Patient here due to new and different pain in her right breast. Has severe  burning pain in her right breast that is going into the 2nd week. The pain lasts 10 secs and comes and goes for 24 hours at a time. Pt. States her pain medicine is not really helping. Both breasts are a "7" pain wise.

## 2020-05-27 ENCOUNTER — Telehealth: Payer: Self-pay | Admitting: *Deleted

## 2020-05-27 ENCOUNTER — Other Ambulatory Visit: Payer: Self-pay | Admitting: Radiation Oncology

## 2020-05-27 DIAGNOSIS — C50412 Malignant neoplasm of upper-outer quadrant of left female breast: Secondary | ICD-10-CM

## 2020-05-27 NOTE — Telephone Encounter (Signed)
Called patient to inform her that I have cancelled her fu with Dr. Sondra Come on 07-04-20, lvm for a return call

## 2020-05-31 NOTE — Addendum Note (Signed)
Encounter addended by: De Burrs, RN on: 05/31/2020 1:20 PM  Actions taken: Charge Capture section accepted

## 2020-06-08 DIAGNOSIS — J3089 Other allergic rhinitis: Secondary | ICD-10-CM | POA: Diagnosis not present

## 2020-06-08 DIAGNOSIS — T781XXD Other adverse food reactions, not elsewhere classified, subsequent encounter: Secondary | ICD-10-CM | POA: Diagnosis not present

## 2020-06-08 DIAGNOSIS — J300 Vasomotor rhinitis: Secondary | ICD-10-CM | POA: Diagnosis not present

## 2020-06-14 ENCOUNTER — Other Ambulatory Visit: Payer: Medicare Other

## 2020-07-04 ENCOUNTER — Other Ambulatory Visit: Payer: Medicare Other

## 2020-07-04 ENCOUNTER — Ambulatory Visit: Payer: Self-pay | Admitting: Radiation Oncology

## 2020-07-12 DIAGNOSIS — E038 Other specified hypothyroidism: Secondary | ICD-10-CM | POA: Diagnosis not present

## 2020-07-12 DIAGNOSIS — Z79899 Other long term (current) drug therapy: Secondary | ICD-10-CM | POA: Diagnosis not present

## 2020-07-12 DIAGNOSIS — M0589 Other rheumatoid arthritis with rheumatoid factor of multiple sites: Secondary | ICD-10-CM | POA: Diagnosis not present

## 2020-07-29 ENCOUNTER — Other Ambulatory Visit: Payer: Self-pay

## 2020-07-29 ENCOUNTER — Ambulatory Visit
Admission: RE | Admit: 2020-07-29 | Discharge: 2020-07-29 | Disposition: A | Discharge status: Home / Self Care | Payer: Medicare Other | Source: Ambulatory Visit | Attending: Radiation Oncology | Admitting: Radiation Oncology

## 2020-07-29 ENCOUNTER — Ambulatory Visit: Payer: Self-pay

## 2020-07-29 DIAGNOSIS — Z853 Personal history of malignant neoplasm of breast: Secondary | ICD-10-CM | POA: Diagnosis not present

## 2020-07-29 DIAGNOSIS — C50412 Malignant neoplasm of upper-outer quadrant of left female breast: Secondary | ICD-10-CM

## 2020-07-29 DIAGNOSIS — R928 Other abnormal and inconclusive findings on diagnostic imaging of breast: Secondary | ICD-10-CM | POA: Diagnosis not present

## 2020-08-18 ENCOUNTER — Other Ambulatory Visit: Payer: Self-pay | Admitting: Radiation Oncology

## 2020-08-18 MED ORDER — HYDROCODONE-ACETAMINOPHEN 5-325 MG PO TABS: 1.0000 | ORAL_TABLET | ORAL | 0 refills | Status: DC | PRN

## 2020-08-22 ENCOUNTER — Telehealth: Payer: Self-pay | Admitting: *Deleted

## 2020-08-22 NOTE — Telephone Encounter (Signed)
Called patient to inform of fu with Dr. Sondra Come on 11-14-20 @ 4 pm, lvm for a return call

## 2020-09-06 DIAGNOSIS — E039 Hypothyroidism, unspecified: Secondary | ICD-10-CM | POA: Diagnosis not present

## 2020-11-07 ENCOUNTER — Ambulatory Visit
Admission: RE | Admit: 2020-11-07 | Discharge: 2020-11-07 | Disposition: A | Discharge status: Home / Self Care | Payer: Medicare Other | Source: Ambulatory Visit | Attending: Radiation Oncology | Admitting: Radiation Oncology

## 2020-11-07 ENCOUNTER — Other Ambulatory Visit: Payer: Self-pay

## 2020-11-07 ENCOUNTER — Encounter: Payer: Self-pay | Admitting: Radiation Oncology

## 2020-11-07 VITALS — BP 134/87 | HR 90 | Temp 97.6°F | Resp 18 | Ht 66.0 in | Wt 121.0 lb

## 2020-11-07 DIAGNOSIS — R110 Nausea: Secondary | ICD-10-CM | POA: Insufficient documentation

## 2020-11-07 DIAGNOSIS — M546 Pain in thoracic spine: Secondary | ICD-10-CM | POA: Insufficient documentation

## 2020-11-07 DIAGNOSIS — Z923 Personal history of irradiation: Secondary | ICD-10-CM | POA: Insufficient documentation

## 2020-11-07 DIAGNOSIS — C50412 Malignant neoplasm of upper-outer quadrant of left female breast: Secondary | ICD-10-CM

## 2020-11-07 DIAGNOSIS — Z853 Personal history of malignant neoplasm of breast: Secondary | ICD-10-CM | POA: Diagnosis not present

## 2020-11-07 DIAGNOSIS — G8929 Other chronic pain: Secondary | ICD-10-CM | POA: Diagnosis not present

## 2020-11-07 MED ORDER — HYDROCODONE-ACETAMINOPHEN 5-325 MG PO TABS: 1.0000 | ORAL_TABLET | ORAL | 0 refills | Status: DC | PRN

## 2020-11-07 MED ORDER — ONDANSETRON HCL 4 MG PO TABS: 4.0000 mg | ORAL_TABLET | Freq: Three times a day (TID) | ORAL | 1 refills | Status: DC | PRN

## 2020-11-07 NOTE — Progress Notes (Signed)
Patient here for a f/u visit. She reports having pain in her breasts and under her arms which is a 6 out of 10.  BP 134/87 (BP Location: Right Arm, Patient Position: Sitting)   Pulse 90   Temp 97.6 F (36.4 C) (Temporal)   Resp 18   Ht 5\' 6"  (1.676 m)   Wt 121 lb (54.9 kg)   SpO2 100%   BMI 19.53 kg/m   Wt Readings from Last 3 Encounters:  11/07/20 121 lb (54.9 kg)  05/26/20 123 lb (55.8 kg)  01/04/20 121 lb (54.9 kg)

## 2020-11-07 NOTE — Progress Notes (Signed)
Radiation Oncology         [phone] ________________________________  Name: Carolyn Garcia MRN: [id number]  Date: 11/07/2020  DOB: 1966/08/15  Follow-Up Visit Note  CC: Carolyn Gravel, MD  Carolyn Medal, MD    ICD-10-CM   1. Malignant neoplasm of upper-outer quadrant of left female breast, unspecified estrogen receptor status ([name])  C50.412   2. 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  Narrative: The patient returns today for follow-up of radiation completed on 05/12/2003. Since her last visit, she underwent a right diagnostic mammogram on 07/29/2020 to evaluate a new burning sensation that she was having to the right breast. There was no mammographic evidence of malignancy or other finding to explain her symptoms.   On review of systems, she reports ongoing pain in both breasts rates this on a level of approximately a 6 out of 10. She denies nipple discharge or bleeding.  She continues to have pain in the upper thoracic spine and neck region.  We talked about repeating her thoracic MRI and she will think about this issue and call back if she wishes to pursue imaging of this issue.  She will get nauseated with her pain level and have given her a prescription for Zofran for this issue..  ALLERGIES:  is allergic to entex, tyloxapol, venlafaxine, and phenylephrine-guaifenesin.  Meds: Current Outpatient Medications  Medication Sig Dispense Refill  . ALPRAZolam (XANAX) 0.25 MG tablet TAKE 1 TABLET BY MOUTH EVERY 6 HOURS AS NEEDED FOR ANXIETY.  Called in to [facility] on [address] per Dr. Sondra Come.    . Biotin 5 MG TBDP Take 1 tablet by mouth daily.    . cholecalciferol (VITAMIN D) 1000 UNITS tablet Take 2,000 Units  by mouth daily.    . cyclobenzaprine (FLEXERIL) 10 MG tablet Take 10 mg by mouth daily as needed.     . Golimumab (SIMPONI) 50 MG/0.5ML SOLN Inject 50 mg into the skin every 30 (thirty) days.    Marland Kitchen HYDROcodone-acetaminophen (NORCO/VICODIN) 5-325 MG tablet Take 1-2 tablets by mouth as needed for moderate pain (Q4-6 hrs). 360 tablet 0  . levocetirizine (XYZAL) 5 MG tablet   5  . levothyroxine (SYNTHROID, LEVOTHROID) 75 MCG tablet Take 75 mcg by mouth daily before breakfast.    . ondansetron (ZOFRAN) 4 MG tablet Take 1 tablet (4 mg total) by mouth every 8 (eight) hours as needed for nausea or vomiting. 30 tablet 1   No current facility-administered medications for this encounter.    Physical Findings: The patient is in no acute distress. Patient is alert and oriented.  height is 5\' 6"  (1.676 m) and weight is 121 lb (54.9 kg). Her temporal temperature is 97.6 F (36.4 C). Her blood pressure is 134/87 and her pulse is 90. Her respiration is 18 and oxygen saturation is 100%.  Lungs are clear to auscultation bilaterally. Heart has regular rate and rhythm. No palpable cervical, supraclavicular, or axillary adenopathy. Abdomen soft, non-tender, normal bowel sounds. Left breast with no palpable mass or nipple discharge. Patient has somewhat of an indention at her lumpectomy scar which has been noted on previous exams.  Right breast with no palpable mass, nipple discharge or bleeding.  She continues to be tender with palpation throughout both breasts  more so along the lumpectomy cavity  Lab Findings: Lab Results  Component Value Date   WBC 4.2 04/13/2015   HGB 12.6 04/13/2015   HCT 38.4 04/13/2015   MCV 93.1 04/13/2015   PLT 220 04/13/2015    Radiographic Findings: No results found.  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  Chronic pain in both breasts for which she takes hydrocodone.  I refilled this  prescription today.  I have also given her a prescription for support bra as she notices discomfort late in the day the swelling in both breasts.  Plan: The patient will follow up with radiation oncology in 6 months.  She is due for bilateral mammograms in December of this year.  Total time spent in this encounter was 22 minutes which included reviewing the patient's most recent diagnostic mammogram, physical examination, and documentation.  ____________________________________  Blair Promise, PhD, MD  This document serves as a record of services personally performed by Gery Pray, MD. It was created on his behalf by Clerance Lav, a trained medical scribe. The creation of this record is based on the scribe's personal observations and the provider's statements to them. This document has been checked and approved by the attending provider.

## 2020-11-14 ENCOUNTER — Ambulatory Visit: Payer: Self-pay | Admitting: Radiation Oncology

## 2020-12-15 IMAGING — MR MRI THORACIC SPINE WITHOUT CONTRAST
4 of 6 series · 23 of 48 positions shown · non-contrast
Comparison: CT of the chest 04/30/2018.

CLINICAL DATA: Mid back pain. Lifting injury. Fell. Breast cancer.

EXAM:
MRI THORACIC SPINE WITHOUT CONTRAST
TECHNIQUE: Multiplanar, multisequence MR imaging of the thoracic spine was
performed. No intravenous contrast was administered.

[Series 17: T1 · sagittal · 3.0mm · 0.83mm/px · 5 of 15 slices shown]
[im 1/15]
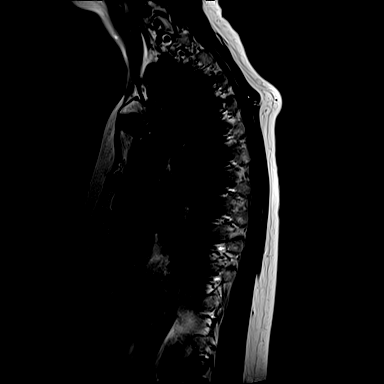
[im 4/15]
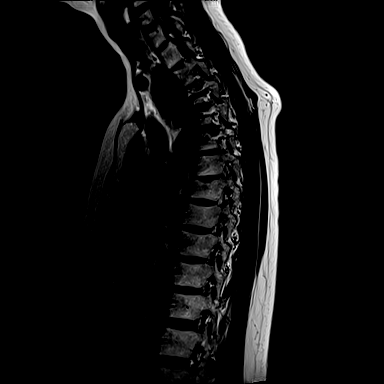
[im 8/15]
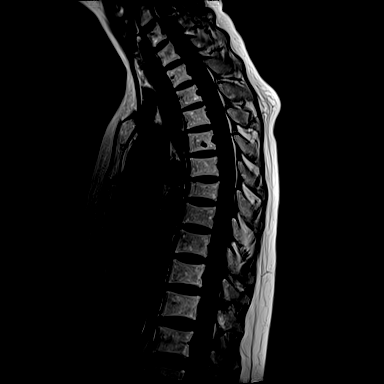
[im 11/15]
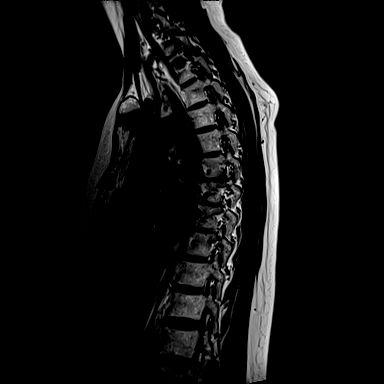
[im 15/15]
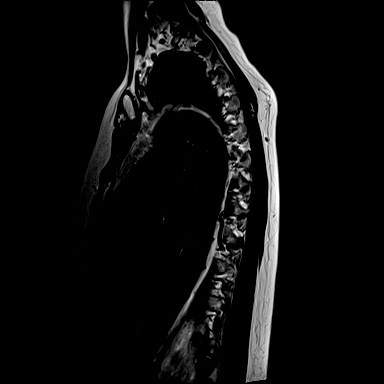

[Series 18: STIR · sagittal · 3.0mm · 1.00mm/px · 5 of 15 slices shown]
[im 1/15]
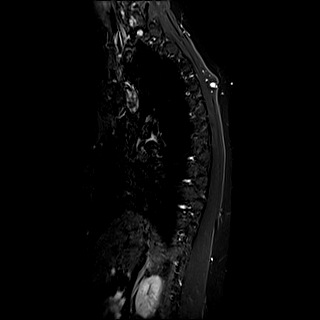
[im 4/15]
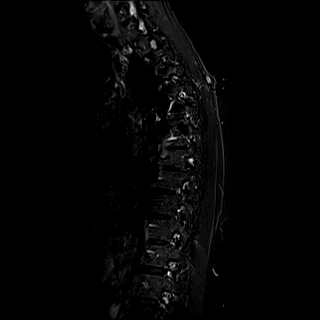
[im 8/15]
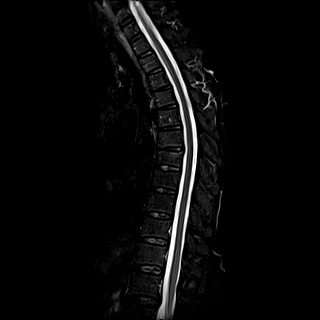
[im 11/15]
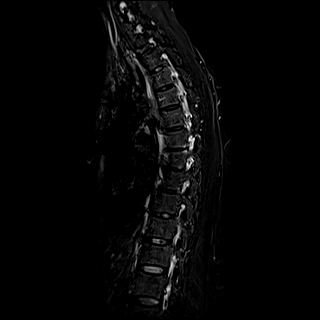
[im 15/15]
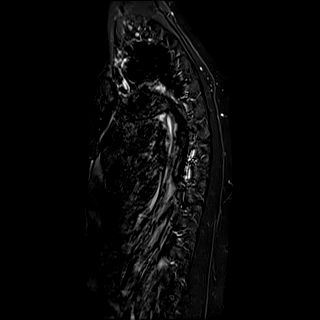

[Series 19: T2 · sagittal · 3.0mm · 0.83mm/px · 5 of 15 slices shown (1 of 2)]
[im 1/15]
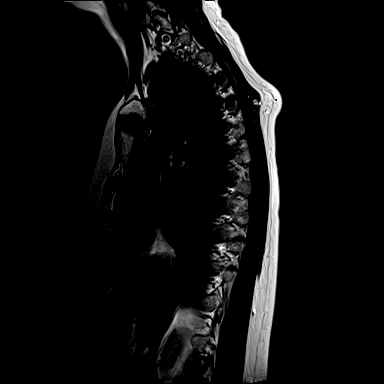
[im 4/15]
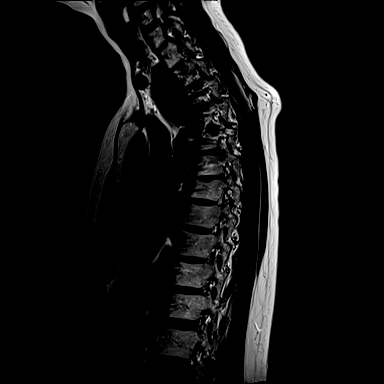
[im 8/15]
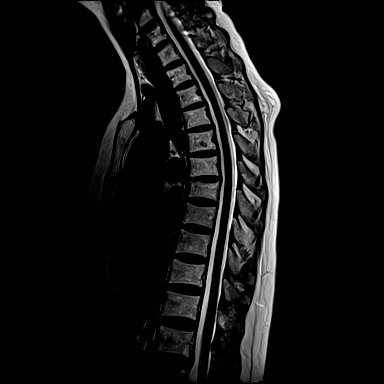
[im 11/15]
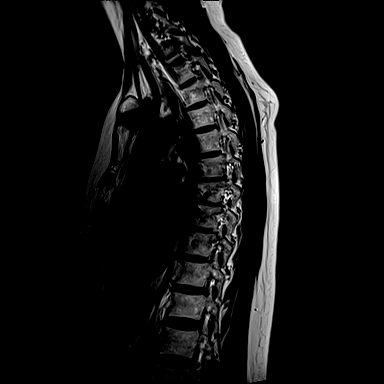
[im 15/15]
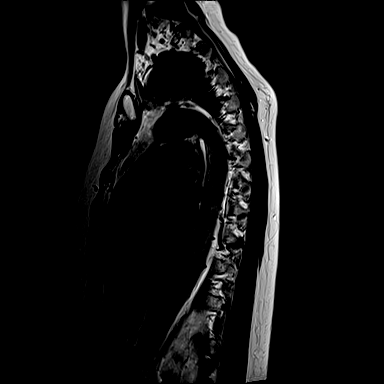

[Series 21: T2 · axial · 4.0mm · 0.28mm/px · z∈[-268,-42]mm · 8 of 43 slices shown (2 of 2)]
[im 1/43]
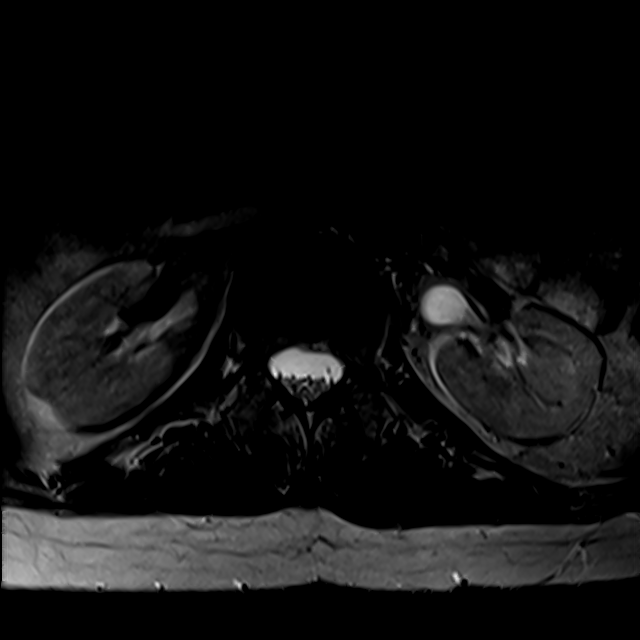
[im 7/43]
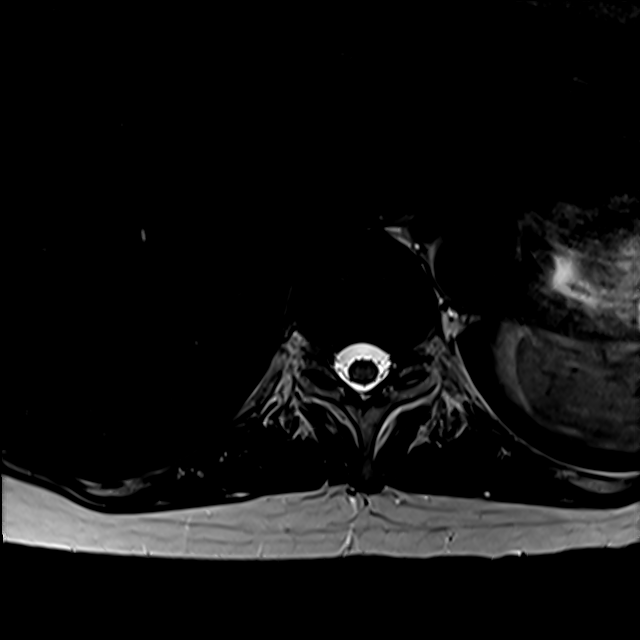
[im 13/43]
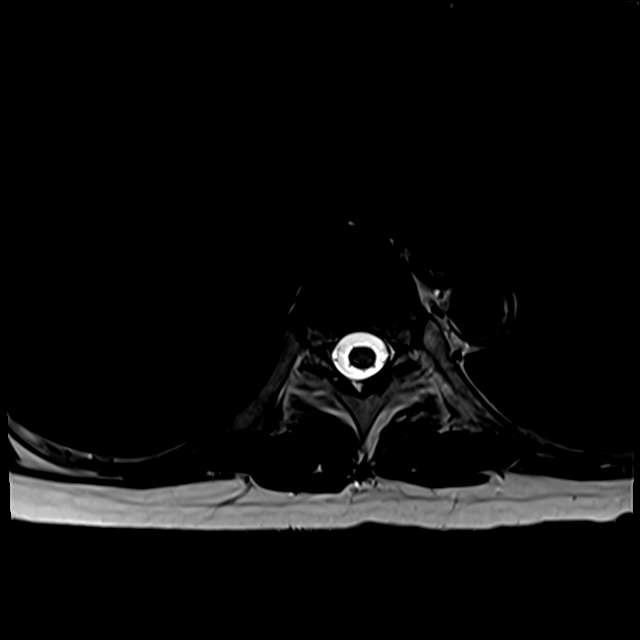
[im 20/43]
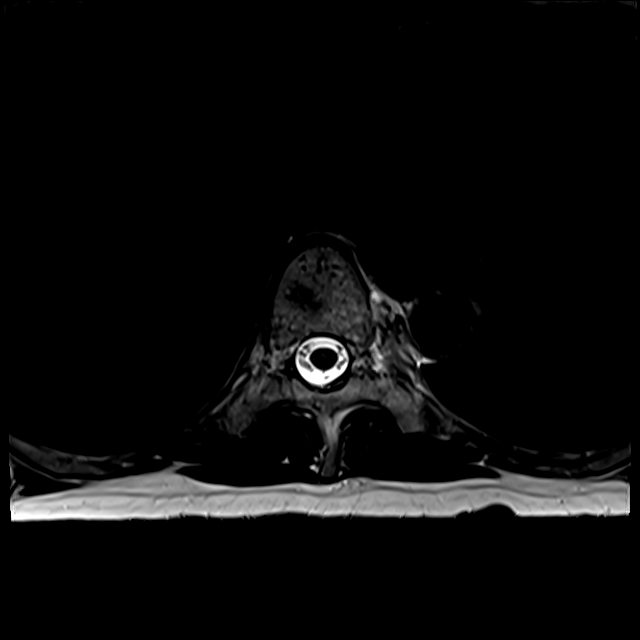
[im 23/43]
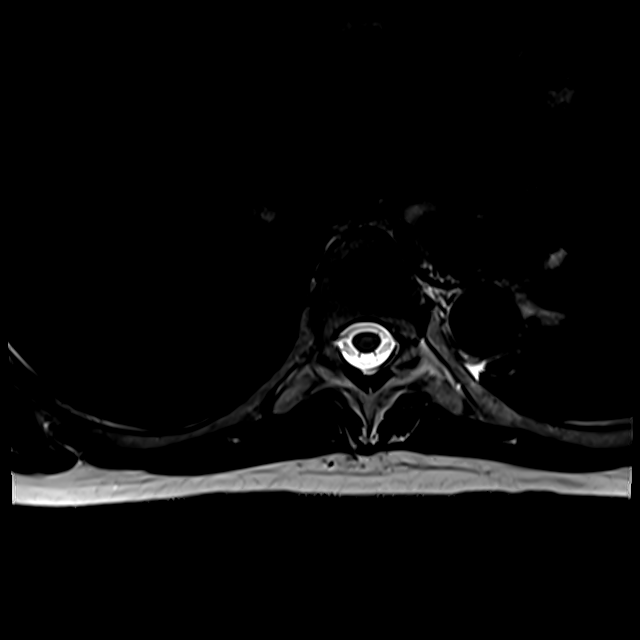
[im 30/43]
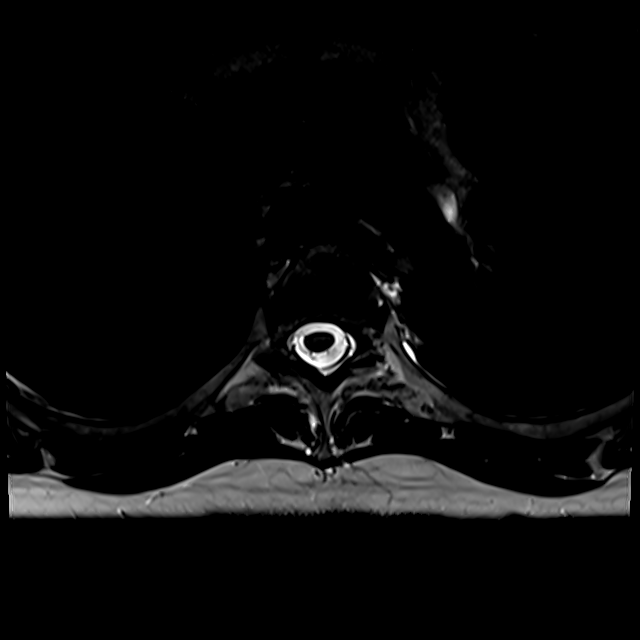
[im 36/43]
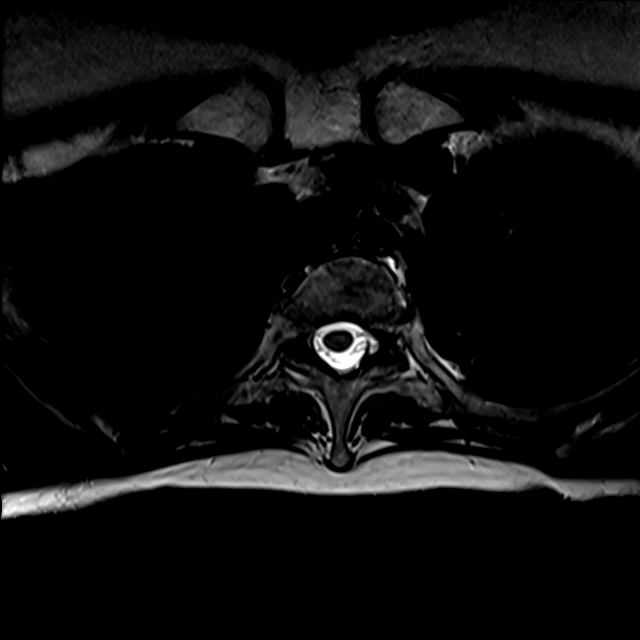
[im 43/43]
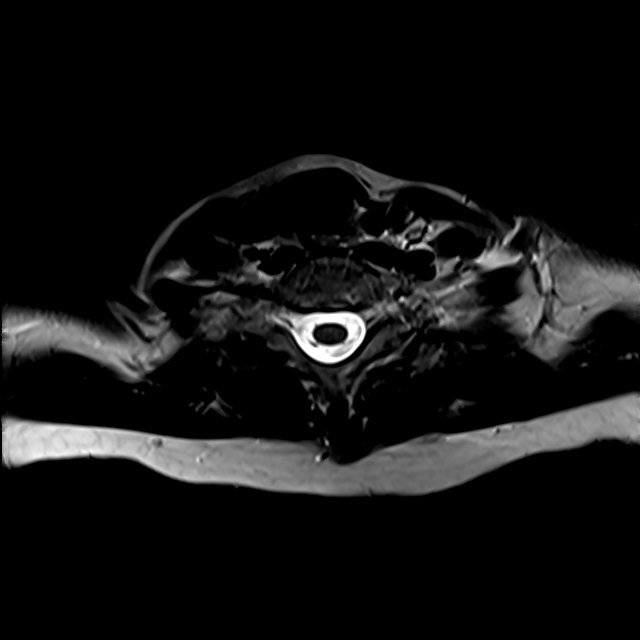

[23 of 48 positions shown; findings below may reference images not displayed]

FINDINGS: Alignment:  Anatomic

Vertebrae: There is a focal endplate deformity of T7, superior
endplate depression, associated with a disc herniation into the
vertebral body, and surrounding bone marrow edema. Findings are
consistent with an acute/subacute Schmorl's node. This abnormality
was not present on the previous CT, and would have been seen if
present. There are no concerning features for metastatic breast
cancer. There is an incidental hemangioma in T5.

Cord:  Normal signal and morphology.

Paraspinal and other soft tissues: Axial images are motion degraded,
but no paravertebral or pulmonary pathology is evident.

Disc levels:

No compressive disc protrusion or stenosis.
IMPRESSION: Focal endplate deformity of T7 superior endplate depression
associated with a T6-T7 disc herniation into the vertebral body,
consistent with an acute/subacute Schmorl's node. Surrounding bone
marrow edema within T7 suggests that this lesion could explain
midthoracic pain. This finding was not present on prior CT chest.

No concerning features for metastatic breast cancer or a compressive
thoracic disc protrusion.

## 2021-01-25 ENCOUNTER — Telehealth: Payer: Self-pay | Admitting: Emergency Medicine

## 2021-01-25 DIAGNOSIS — E039 Hypothyroidism, unspecified: Secondary | ICD-10-CM | POA: Diagnosis not present

## 2021-01-26 ENCOUNTER — Other Ambulatory Visit: Payer: Self-pay | Admitting: Radiation Oncology

## 2021-01-26 MED ORDER — HYDROCODONE-ACETAMINOPHEN 5-325 MG PO TABS: 1.0000 | ORAL_TABLET | ORAL | 0 refills | Status: DC | PRN

## 2021-01-31 DIAGNOSIS — Z13220 Encounter for screening for lipoid disorders: Secondary | ICD-10-CM | POA: Diagnosis not present

## 2021-01-31 DIAGNOSIS — R5383 Other fatigue: Secondary | ICD-10-CM | POA: Diagnosis not present

## 2021-01-31 DIAGNOSIS — M810 Age-related osteoporosis without current pathological fracture: Secondary | ICD-10-CM | POA: Diagnosis not present

## 2021-01-31 DIAGNOSIS — E039 Hypothyroidism, unspecified: Secondary | ICD-10-CM | POA: Diagnosis not present

## 2021-01-31 DIAGNOSIS — M0589 Other rheumatoid arthritis with rheumatoid factor of multiple sites: Secondary | ICD-10-CM | POA: Diagnosis not present

## 2021-01-31 DIAGNOSIS — Z0000 Encounter for general adult medical examination without abnormal findings: Secondary | ICD-10-CM | POA: Diagnosis not present

## 2021-01-31 DIAGNOSIS — Z79899 Other long term (current) drug therapy: Secondary | ICD-10-CM | POA: Diagnosis not present

## 2021-02-22 DIAGNOSIS — M810 Age-related osteoporosis without current pathological fracture: Secondary | ICD-10-CM | POA: Diagnosis not present

## 2021-02-22 DIAGNOSIS — Z681 Body mass index (BMI) 19 or less, adult: Secondary | ICD-10-CM | POA: Diagnosis not present

## 2021-02-22 DIAGNOSIS — M5136 Other intervertebral disc degeneration, lumbar region: Secondary | ICD-10-CM | POA: Diagnosis not present

## 2021-02-22 DIAGNOSIS — M25551 Pain in right hip: Secondary | ICD-10-CM | POA: Diagnosis not present

## 2021-02-22 DIAGNOSIS — M797 Fibromyalgia: Secondary | ICD-10-CM | POA: Diagnosis not present

## 2021-02-22 DIAGNOSIS — M5030 Other cervical disc degeneration, unspecified cervical region: Secondary | ICD-10-CM | POA: Diagnosis not present

## 2021-02-22 DIAGNOSIS — M3500 Sicca syndrome, unspecified: Secondary | ICD-10-CM | POA: Diagnosis not present

## 2021-02-22 DIAGNOSIS — M0589 Other rheumatoid arthritis with rheumatoid factor of multiple sites: Secondary | ICD-10-CM | POA: Diagnosis not present

## 2021-02-27 DIAGNOSIS — M797 Fibromyalgia: Secondary | ICD-10-CM | POA: Diagnosis not present

## 2021-02-27 DIAGNOSIS — R5382 Chronic fatigue, unspecified: Secondary | ICD-10-CM | POA: Diagnosis not present

## 2021-02-27 DIAGNOSIS — E039 Hypothyroidism, unspecified: Secondary | ICD-10-CM | POA: Diagnosis not present

## 2021-02-27 DIAGNOSIS — J301 Allergic rhinitis due to pollen: Secondary | ICD-10-CM | POA: Diagnosis not present

## 2021-02-27 DIAGNOSIS — Z853 Personal history of malignant neoplasm of breast: Secondary | ICD-10-CM | POA: Diagnosis not present

## 2021-02-27 DIAGNOSIS — Z681 Body mass index (BMI) 19 or less, adult: Secondary | ICD-10-CM | POA: Diagnosis not present

## 2021-03-29 ENCOUNTER — Telehealth: Payer: Self-pay | Admitting: *Deleted

## 2021-03-29 NOTE — Telephone Encounter (Signed)
Returned patient's phone call, lvm 

## 2021-03-30 ENCOUNTER — Telehealth: Payer: Self-pay | Admitting: *Deleted

## 2021-03-30 NOTE — Telephone Encounter (Signed)
RETURNED PATIENT'S PHONE CALL, LVM FOR A RETURN CALL 

## 2021-04-11 ENCOUNTER — Telehealth: Payer: Self-pay

## 2021-04-11 ENCOUNTER — Other Ambulatory Visit: Payer: Self-pay | Admitting: Radiation Oncology

## 2021-04-11 MED ORDER — HYDROCODONE-ACETAMINOPHEN 5-325 MG PO TABS: 1.0000 | ORAL_TABLET | ORAL | 0 refills | Status: DC | PRN

## 2021-04-11 NOTE — Telephone Encounter (Signed)
Patient called in requesting medication refill on Hydrocodone- APAP 5/325. Requesting medication be sent to [facility] on [address].

## 2021-04-25 DIAGNOSIS — R232 Flushing: Secondary | ICD-10-CM | POA: Diagnosis not present

## 2021-04-25 DIAGNOSIS — E039 Hypothyroidism, unspecified: Secondary | ICD-10-CM | POA: Diagnosis not present

## 2021-05-09 DIAGNOSIS — R232 Flushing: Secondary | ICD-10-CM | POA: Diagnosis not present

## 2021-05-09 DIAGNOSIS — J301 Allergic rhinitis due to pollen: Secondary | ICD-10-CM | POA: Diagnosis not present

## 2021-05-09 DIAGNOSIS — R5382 Chronic fatigue, unspecified: Secondary | ICD-10-CM | POA: Diagnosis not present

## 2021-05-09 DIAGNOSIS — M797 Fibromyalgia: Secondary | ICD-10-CM | POA: Diagnosis not present

## 2021-05-09 DIAGNOSIS — M059 Rheumatoid arthritis with rheumatoid factor, unspecified: Secondary | ICD-10-CM | POA: Diagnosis not present

## 2021-05-09 DIAGNOSIS — Z853 Personal history of malignant neoplasm of breast: Secondary | ICD-10-CM | POA: Diagnosis not present

## 2021-05-09 DIAGNOSIS — E039 Hypothyroidism, unspecified: Secondary | ICD-10-CM | POA: Diagnosis not present

## 2021-05-09 DIAGNOSIS — M810 Age-related osteoporosis without current pathological fracture: Secondary | ICD-10-CM | POA: Diagnosis not present

## 2021-05-09 DIAGNOSIS — Z681 Body mass index (BMI) 19 or less, adult: Secondary | ICD-10-CM | POA: Diagnosis not present

## 2021-05-18 ENCOUNTER — Ambulatory Visit
Admission: RE | Admit: 2021-05-18 | Discharge: 2021-05-18 | Disposition: A | Discharge status: Home / Self Care | Payer: Medicare Other | Source: Ambulatory Visit | Attending: Radiation Oncology | Admitting: Radiation Oncology

## 2021-06-08 ENCOUNTER — Telehealth: Payer: Self-pay | Admitting: *Deleted

## 2021-06-08 NOTE — Telephone Encounter (Signed)
RETURNED PATIENT'S PHONE CALL, LVM WITH APPT. DATE OF 07-03-21 @ 4:15 PM WITH DR. Sondra Come

## 2021-06-21 DIAGNOSIS — T781XXD Other adverse food reactions, not elsewhere classified, subsequent encounter: Secondary | ICD-10-CM | POA: Diagnosis not present

## 2021-06-21 DIAGNOSIS — J3089 Other allergic rhinitis: Secondary | ICD-10-CM | POA: Diagnosis not present

## 2021-06-21 DIAGNOSIS — J300 Vasomotor rhinitis: Secondary | ICD-10-CM | POA: Diagnosis not present

## 2021-06-29 ENCOUNTER — Other Ambulatory Visit: Payer: Self-pay | Admitting: Radiation Oncology

## 2021-06-29 ENCOUNTER — Telehealth: Payer: Self-pay | Admitting: Radiology

## 2021-06-29 MED ORDER — HYDROCODONE-ACETAMINOPHEN 5-325 MG PO TABS: 1.0000 | ORAL_TABLET | ORAL | 0 refills | Status: DC | PRN

## 2021-06-29 NOTE — Telephone Encounter (Signed)
Patient requests a refill of hydrocodone to be sent to [facility] on [address]

## 2021-06-30 NOTE — Progress Notes (Signed)
Radiation Oncology         [phone] ________________________________  Name: Carolyn Garcia MRN: [id number]  Date: 07/03/2021  DOB: 1966-10-31  Follow-Up Visit Note  CC: Jani Gravel, MD  Tommy Medal, MD    ICD-10-CM   1. Malignant neoplasm of left female breast, unspecified estrogen receptor status, unspecified site of breast ([place])  C50.912 NM Bone Scan Limited    2. 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   Narrative:  The patient returns today for routine follow-up.  (The patient was last seen for follow-up on 11/07/20).           The patient otherwise has not had any recent imaging, labs, or visits relevant to her diagnosis.           she continues to complain of pain in both breasts more so around the lumpectomy cavity on the left side.  She denies any nipple discharge or bleeding.  She also pain in the right hip area which makes it difficult for her to sleep in certain positions and does occasionally wake her up.  This is been going on for approximately a month.  She also has some left rib pain in the lateral aspect of rib cage but pain is not as significant as the right pelvis.  She did miss her bilateral diagnostic mammograms in December of last year and have recommended she going to be scheduled as soon as possible.  Patient reports being very busy taking care of both parents.  She reported also having more problems with hot flashes and night sweats.              Allergies:  is allergic to entex, tyloxapol, venlafaxine, and phenylephrine-guaifenesin.  Meds: Current Outpatient Medications  Medication Sig Dispense  Refill   ALPRAZolam (XANAX) 0.25 MG tablet TAKE 1 TABLET BY MOUTH EVERY 6 HOURS AS NEEDED FOR ANXIETY.  Called in to [facility] on [address] per Dr. Sondra Come.     Biotin 5 MG TBDP Take 1 tablet by mouth daily.     cholecalciferol (VITAMIN D) 1000 UNITS tablet Take 2,000 Units by mouth daily.     cyclobenzaprine (FLEXERIL) 10 MG tablet Take 10 mg by mouth daily as needed.      Golimumab 50 MG/0.5ML SOLN Inject 50 mg into the skin every 30 (thirty) days.     HYDROcodone-acetaminophen (NORCO/VICODIN) 5-325 MG tablet Take 1-2 tablets by mouth as needed for moderate pain (Q4-6 hrs). 360 tablet 0   levocetirizine (XYZAL) 5 MG tablet   5   levothyroxine (SYNTHROID, LEVOTHROID) 75 MCG tablet Take 75 mcg by mouth daily before breakfast.     ondansetron (ZOFRAN) 4 MG tablet Take 1 tablet (4 mg total) by mouth every 8 (eight) hours as needed for nausea or vomiting. 30 tablet 1   ondansetron (ZOFRAN) 4 MG tablet Take 1 tablet (4 mg total) by mouth every 8 (eight) hours as needed for nausea or vomiting. 20 tablet 0   No current facility-administered medications for this encounter.    Physical Findings: The patient is in no acute distress. Patient is alert and oriented.  height is 5\' 5"  (1.651 m)  and weight is 123 lb 12.8 oz (56.2 kg). Her temperature is 97.6 F (36.4 C). Her blood pressure is 120/84 and her pulse is 84. Her respiration is 18 and oxygen saturation is 100%. .  No significant changes. Lungs are clear to auscultation bilaterally. Heart has regular rate and rhythm. No palpable cervical, supraclavicular, or axillary adenopathy. Abdomen soft, non-tender, normal bowel sounds.  Examination of the left breast reveals a lumpectomy scar in the upper outer quadrant.  Palpation along the breast reveals no palpable mass nipple discharge or bleeding.  Patient continues to the tender in the left breast more so along the lumpectomy cavity region.  Examination of the right breast reveals no palpable mass nipple  discharge or bleeding.  She also has diffuse tenderness with palpation in the right breast in addition.   Lab Findings: Lab Results  Component Value Date   WBC 4.2 04/13/2015   HGB 12.6 04/13/2015   HCT 38.4 04/13/2015   MCV 93.1 04/13/2015   PLT 220 04/13/2015    Radiographic Findings: No results found.  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  As above the patient will get her mammogram scheduled soon as possible.  She does have a new symptom of right pelvis pain which is waking her up in sleep.  Given her history of breast cancer we will proceed with ordering a bone scan for further evaluation of this issue, possibly plain x-rays.  She continues to have some intermittent nausea and have refilled her Zofran prescription today.  Prescription for hydrocodone was refilled last week.  Plan: Routine follow-up in 6 months unless bone scan shows concerning issues.   25 minutes of total time was spent for this patient encounter, including preparation, face-to-face counseling with the patient and coordination of care, physical exam, and documentation of the encounter and ordering of the imaging studies. ____________________________________  Blair Promise, PhD, MD   This document serves as a record of services personally performed by Gery Pray, MD. It was created on his behalf by Roney Mans, a trained medical scribe. The creation of this record is based on the scribe's personal observations and the provider's statements to them. This document has been checked and approved by the attending provider.

## 2021-07-03 ENCOUNTER — Encounter: Payer: Self-pay | Admitting: Radiation Oncology

## 2021-07-03 ENCOUNTER — Ambulatory Visit
Admission: RE | Admit: 2021-07-03 | Discharge: 2021-07-03 | Disposition: A | Discharge status: Home / Self Care | Payer: Medicare Other | Source: Ambulatory Visit | Attending: Radiation Oncology | Admitting: Radiation Oncology

## 2021-07-03 ENCOUNTER — Other Ambulatory Visit: Payer: Self-pay

## 2021-07-03 VITALS — BP 120/84 | HR 84 | Temp 97.6°F | Resp 18 | Ht 65.0 in | Wt 123.8 lb

## 2021-07-03 DIAGNOSIS — M25551 Pain in right hip: Secondary | ICD-10-CM | POA: Insufficient documentation

## 2021-07-03 DIAGNOSIS — R0781 Pleurodynia: Secondary | ICD-10-CM | POA: Insufficient documentation

## 2021-07-03 DIAGNOSIS — N644 Mastodynia: Secondary | ICD-10-CM | POA: Diagnosis not present

## 2021-07-03 DIAGNOSIS — Z923 Personal history of irradiation: Secondary | ICD-10-CM | POA: Insufficient documentation

## 2021-07-03 DIAGNOSIS — Z170 Estrogen receptor positive status [ER+]: Secondary | ICD-10-CM | POA: Diagnosis not present

## 2021-07-03 DIAGNOSIS — C50412 Malignant neoplasm of upper-outer quadrant of left female breast: Secondary | ICD-10-CM | POA: Diagnosis not present

## 2021-07-03 DIAGNOSIS — C50912 Malignant neoplasm of unspecified site of left female breast: Secondary | ICD-10-CM

## 2021-07-03 DIAGNOSIS — Z08 Encounter for follow-up examination after completed treatment for malignant neoplasm: Secondary | ICD-10-CM | POA: Diagnosis not present

## 2021-07-03 DIAGNOSIS — R61 Generalized hyperhidrosis: Secondary | ICD-10-CM | POA: Diagnosis not present

## 2021-07-03 DIAGNOSIS — Z79899 Other long term (current) drug therapy: Secondary | ICD-10-CM | POA: Insufficient documentation

## 2021-07-03 DIAGNOSIS — Z853 Personal history of malignant neoplasm of breast: Secondary | ICD-10-CM | POA: Diagnosis not present

## 2021-07-03 DIAGNOSIS — R232 Flushing: Secondary | ICD-10-CM | POA: Diagnosis not present

## 2021-07-03 MED ORDER — ONDANSETRON HCL 4 MG PO TABS: 4.0000 mg | ORAL_TABLET | Freq: Three times a day (TID) | ORAL | 0 refills | Status: AC | PRN

## 2021-07-03 NOTE — Progress Notes (Signed)
Carolyn Garcia is here today for follow up post radiation to the breast.   Breast Side:left   They completed their radiation on: 05/12/2003  Does the patient complain of any of the following: Post radiation skin issues: intact Breast Tenderness: yes, patient reports having a stinging sensation at times. Breast hurts when touched. Breast Swelling: no Lymphadema: no Range of Motion limitations: limited  Fatigue post radiation: Moderate  Appetite good/fair/poor:  Fair   Additional comments if applicable:   Vitals:   07/03/21 1554  BP: 120/84  Pulse: 84  Resp: 18  Temp: 97.6 F (36.4 C)  SpO2: 100%  Weight: 123 lb 12.8 oz (56.2 kg)  Height: 5\' 5"  (1.651 m)

## 2021-07-04 ENCOUNTER — Telehealth: Payer: Self-pay | Admitting: *Deleted

## 2021-07-04 NOTE — Telephone Encounter (Signed)
CALLED PATIENT TO INFORM OF FU APPT. WITH DR. Washington ON 01-04-22 @ 4 PM, LVM FOR A RETURN CALL

## 2021-07-18 DIAGNOSIS — M25551 Pain in right hip: Secondary | ICD-10-CM | POA: Diagnosis not present

## 2021-07-18 DIAGNOSIS — M0589 Other rheumatoid arthritis with rheumatoid factor of multiple sites: Secondary | ICD-10-CM | POA: Diagnosis not present

## 2021-07-18 DIAGNOSIS — M7989 Other specified soft tissue disorders: Secondary | ICD-10-CM | POA: Diagnosis not present

## 2021-07-18 DIAGNOSIS — M5030 Other cervical disc degeneration, unspecified cervical region: Secondary | ICD-10-CM | POA: Diagnosis not present

## 2021-07-18 DIAGNOSIS — M5136 Other intervertebral disc degeneration, lumbar region: Secondary | ICD-10-CM | POA: Diagnosis not present

## 2021-07-18 DIAGNOSIS — M810 Age-related osteoporosis without current pathological fracture: Secondary | ICD-10-CM | POA: Diagnosis not present

## 2021-07-18 DIAGNOSIS — Z6820 Body mass index (BMI) 20.0-20.9, adult: Secondary | ICD-10-CM | POA: Diagnosis not present

## 2021-07-18 DIAGNOSIS — M3500 Sicca syndrome, unspecified: Secondary | ICD-10-CM | POA: Diagnosis not present

## 2021-07-18 DIAGNOSIS — M797 Fibromyalgia: Secondary | ICD-10-CM | POA: Diagnosis not present

## 2021-07-19 ENCOUNTER — Other Ambulatory Visit: Payer: Self-pay | Admitting: Radiation Oncology

## 2021-07-19 DIAGNOSIS — C50412 Malignant neoplasm of upper-outer quadrant of left female breast: Secondary | ICD-10-CM

## 2021-07-21 ENCOUNTER — Telehealth: Payer: Self-pay | Admitting: *Deleted

## 2021-07-21 NOTE — Telephone Encounter (Signed)
CALLED PATIENT TO INFORM OF BONE SCAN ON 07-28-21- 12PM - FOR INJECTION AND RETURN FOR 3 PM TO BE SCANNED @ [facility], NO RESTRICTIONS TO TEST, LVM FOR A RETURN CALL

## 2021-07-28 ENCOUNTER — Encounter (HOSPITAL_COMMUNITY): Payer: Self-pay

## 2021-07-28 ENCOUNTER — Encounter (HOSPITAL_COMMUNITY): Payer: Medicare Other

## 2021-08-29 ENCOUNTER — Other Ambulatory Visit: Payer: Self-pay

## 2021-08-29 ENCOUNTER — Ambulatory Visit (HOSPITAL_COMMUNITY)
Admission: RE | Admit: 2021-08-29 | Discharge: 2021-08-29 | Disposition: A | Discharge status: Home / Self Care | Payer: Medicare Other | Source: Ambulatory Visit | Attending: Radiation Oncology | Admitting: Radiation Oncology

## 2021-08-29 ENCOUNTER — Encounter (HOSPITAL_COMMUNITY)
Admission: RE | Admit: 2021-08-29 | Discharge: 2021-08-29 | Disposition: A | Discharge status: Home / Self Care | Payer: Medicare Other | Source: Ambulatory Visit | Attending: Radiation Oncology | Admitting: Radiation Oncology

## 2021-08-29 DIAGNOSIS — M545 Low back pain, unspecified: Secondary | ICD-10-CM | POA: Diagnosis not present

## 2021-08-29 DIAGNOSIS — C50412 Malignant neoplasm of upper-outer quadrant of left female breast: Secondary | ICD-10-CM

## 2021-08-29 DIAGNOSIS — M25551 Pain in right hip: Secondary | ICD-10-CM | POA: Diagnosis not present

## 2021-08-29 DIAGNOSIS — C50919 Malignant neoplasm of unspecified site of unspecified female breast: Secondary | ICD-10-CM | POA: Diagnosis not present

## 2021-08-29 MED ORDER — TECHNETIUM TC 99M MEDRONATE IV KIT
20.5000 | PACK | Freq: Once | INTRAVENOUS | Status: AC
  Administered 2021-08-29: 20.5 via INTRAVENOUS

## 2021-08-30 ENCOUNTER — Other Ambulatory Visit: Payer: Self-pay | Admitting: Radiation Oncology

## 2021-08-30 DIAGNOSIS — Z1231 Encounter for screening mammogram for malignant neoplasm of breast: Secondary | ICD-10-CM

## 2021-09-06 ENCOUNTER — Telehealth: Payer: Self-pay

## 2021-09-06 NOTE — Telephone Encounter (Signed)
Patient called in and requesting that Dr. Sondra Come review most recent bone scan that was performed on 08/29/21.

## 2021-09-08 ENCOUNTER — Other Ambulatory Visit: Payer: Self-pay | Admitting: Radiation Oncology

## 2021-09-08 DIAGNOSIS — C50412 Malignant neoplasm of upper-outer quadrant of left female breast: Secondary | ICD-10-CM

## 2021-09-08 NOTE — Telephone Encounter (Signed)
Called and left voicemail for patient  to make aware of Dr. Clabe Seal recommendations  for CT chest which was indicated on most recent bone scan. Made patient aware that order has already been placed for CT chest,  currently awaiting approval from insurance company.

## 2021-09-13 ENCOUNTER — Telehealth: Payer: Self-pay | Admitting: Radiology

## 2021-09-13 NOTE — Telephone Encounter (Signed)
Patient requests refill of HYDROcodone-acetaminophen (NORCO/VICODIN) 5-325 MG tablet to be sent to [facility] at the corner of [address].

## 2021-09-14 ENCOUNTER — Other Ambulatory Visit: Payer: Self-pay | Admitting: Radiation Oncology

## 2021-09-14 MED ORDER — HYDROCODONE-ACETAMINOPHEN 5-325 MG PO TABS: 1.0000 | ORAL_TABLET | ORAL | 0 refills | Status: DC | PRN

## 2021-09-19 ENCOUNTER — Encounter (HOSPITAL_COMMUNITY): Payer: Self-pay

## 2021-09-19 ENCOUNTER — Ambulatory Visit (HOSPITAL_COMMUNITY): Payer: Medicare Other

## 2021-09-19 ENCOUNTER — Other Ambulatory Visit: Payer: Self-pay

## 2021-09-21 ENCOUNTER — Telehealth: Payer: Self-pay | Admitting: *Deleted

## 2021-09-21 NOTE — Telephone Encounter (Signed)
RETURNED PATIENT'S PHONE CALL, LVM FOR A RETURN CALL 

## 2021-09-26 ENCOUNTER — Telehealth: Payer: Self-pay | Admitting: *Deleted

## 2021-09-26 NOTE — Telephone Encounter (Signed)
RETURNED PATIENT'S PHONE CALL, LVM FOR A RETURN CALL 

## 2021-09-27 ENCOUNTER — Telehealth: Payer: Self-pay | Admitting: *Deleted

## 2021-09-27 NOTE — Telephone Encounter (Signed)
RETURNED PATIENT'S PHONE CALL, LVM FOR A RETURN CALL 

## 2021-10-02 ENCOUNTER — Telehealth: Payer: Self-pay | Admitting: *Deleted

## 2021-10-02 NOTE — Telephone Encounter (Signed)
RETURNED PATIENT'S PHONE CALL, LVM FOR A RETURN CALL 

## 2021-10-03 ENCOUNTER — Telehealth: Payer: Self-pay | Admitting: *Deleted

## 2021-10-03 NOTE — Telephone Encounter (Signed)
RETURNED PATIENT'S PHONE CALL, LVM FOR A RETURN CALL 

## 2021-10-11 ENCOUNTER — Ambulatory Visit: Payer: Medicare Other

## 2021-10-11 ENCOUNTER — Telehealth: Payer: Self-pay | Admitting: *Deleted

## 2021-10-11 NOTE — Telephone Encounter (Signed)
RETURNED PATIENT'S PHONE CALL, LVM FOR A RETURN CALL 

## 2021-10-17 ENCOUNTER — Other Ambulatory Visit: Payer: Self-pay | Admitting: Radiation Oncology

## 2021-10-17 DIAGNOSIS — C50412 Malignant neoplasm of upper-outer quadrant of left female breast: Secondary | ICD-10-CM

## 2021-10-18 ENCOUNTER — Telehealth: Payer: Self-pay | Admitting: *Deleted

## 2021-10-18 NOTE — Telephone Encounter (Signed)
CALLED PATIENT TO INFORM OF CT FOR 11-02-21- ARRIVAL TIME- 3:40 PM, PATIENT TO NOT TO EAT ANYTHING 4 HRS. PRIOR TO TEST, TEST TO BE @ [facility]- ADDRESS- [place] LVM FOR A RETURN CALL

## 2021-10-18 NOTE — Telephone Encounter (Signed)
XXXX 

## 2021-10-25 ENCOUNTER — Telehealth: Payer: Self-pay | Admitting: *Deleted

## 2021-10-25 NOTE — Telephone Encounter (Signed)
RETURNED PATIENT'S PHONE CALL, LVM FOR A RETURN CALL 

## 2021-11-02 ENCOUNTER — Inpatient Hospital Stay: Admission: RE | Admit: 2021-11-02 | Payer: Medicare Other | Source: Ambulatory Visit

## 2021-11-06 ENCOUNTER — Telehealth: Payer: Self-pay | Admitting: *Deleted

## 2021-11-06 NOTE — Telephone Encounter (Signed)
Called patient to ask question, lvm for a return call 

## 2021-11-07 ENCOUNTER — Telehealth: Payer: Self-pay | Admitting: *Deleted

## 2021-11-07 NOTE — Telephone Encounter (Signed)
Returned patient's phone call, lvm for a return call 

## 2021-11-09 DIAGNOSIS — E039 Hypothyroidism, unspecified: Secondary | ICD-10-CM | POA: Diagnosis not present

## 2021-11-14 DIAGNOSIS — R232 Flushing: Secondary | ICD-10-CM | POA: Diagnosis not present

## 2021-11-14 DIAGNOSIS — M810 Age-related osteoporosis without current pathological fracture: Secondary | ICD-10-CM | POA: Diagnosis not present

## 2021-11-14 DIAGNOSIS — J301 Allergic rhinitis due to pollen: Secondary | ICD-10-CM | POA: Diagnosis not present

## 2021-11-14 DIAGNOSIS — E039 Hypothyroidism, unspecified: Secondary | ICD-10-CM | POA: Diagnosis not present

## 2021-11-14 DIAGNOSIS — M059 Rheumatoid arthritis with rheumatoid factor, unspecified: Secondary | ICD-10-CM | POA: Diagnosis not present

## 2021-11-14 DIAGNOSIS — Z853 Personal history of malignant neoplasm of breast: Secondary | ICD-10-CM | POA: Diagnosis not present

## 2021-11-14 DIAGNOSIS — Z23 Encounter for immunization: Secondary | ICD-10-CM | POA: Diagnosis not present

## 2021-11-14 DIAGNOSIS — Z681 Body mass index (BMI) 19 or less, adult: Secondary | ICD-10-CM | POA: Diagnosis not present

## 2021-11-14 DIAGNOSIS — M797 Fibromyalgia: Secondary | ICD-10-CM | POA: Diagnosis not present

## 2021-11-14 DIAGNOSIS — R5382 Chronic fatigue, unspecified: Secondary | ICD-10-CM | POA: Diagnosis not present

## 2021-11-29 ENCOUNTER — Other Ambulatory Visit: Payer: Self-pay | Admitting: Radiation Oncology

## 2021-11-29 MED ORDER — HYDROCODONE-ACETAMINOPHEN 5-325 MG PO TABS: 1.0000 | ORAL_TABLET | ORAL | 0 refills | Status: DC | PRN

## 2021-12-05 ENCOUNTER — Telehealth: Payer: Self-pay | Admitting: *Deleted

## 2021-12-05 NOTE — Telephone Encounter (Signed)
RETURNED PATIENT'S PHONE CALL, LVM FOR A RETURN CALL 

## 2022-01-04 ENCOUNTER — Ambulatory Visit: Payer: Self-pay | Admitting: Radiation Oncology

## 2022-01-05 ENCOUNTER — Encounter: Payer: Self-pay | Admitting: Radiology

## 2022-01-10 NOTE — Progress Notes (Incomplete)
Radiation Oncology         [phone] ________________________________  Name: Carolyn Garcia MRN: [id number]  Date: 01/11/2022  DOB: 1966/09/18  Follow-Up Visit Note  CC: [name], FNP  Tommy Medal, MD  No diagnosis found.  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  Narrative:  The patient returns today for routine follow-up (she was last seen here for follow up on 07/03/21).       Whole body bone scan, prompted due to history of right hip and low back pain, on 08/29/21 revealed focal radiotracer accumulation within an anterior right rib; noted to possibly represent a site of metastatic disease or trauma. No additional sites of uptake were identified to suggest osseous metastatic disease.  Overall, no significant history since the patient was last seen.   Of note: the patient was scheduled for follow up chest CT in November, however due to scheduling conflicts it was not performed.                            Allergies:  is allergic to entex, tyloxapol, venlafaxine, and phenylephrine-guaifenesin.  Meds: Current Outpatient Medications  Medication Sig Dispense Refill   ALPRAZolam (XANAX) 0.25 MG tablet TAKE 1 TABLET BY MOUTH EVERY 6 HOURS AS NEEDED FOR ANXIETY.  Called in to [facility] on [address] per Dr. Sondra Come.     Biotin 5 MG TBDP Take 1 tablet by mouth daily.     cholecalciferol (VITAMIN D) 1000 UNITS tablet Take 2,000 Units by mouth daily.     cyclobenzaprine (FLEXERIL) 10 MG tablet Take 10 mg by mouth daily as needed.      Golimumab 50 MG/0.5ML SOLN Inject 50 mg into the skin every 30 (thirty) days.     HYDROcodone-acetaminophen (NORCO/VICODIN) 5-325 MG tablet Take 1-2 tablets by mouth as needed for moderate pain (Q4-6 hrs). 360 tablet 0    levocetirizine (XYZAL) 5 MG tablet   5   levothyroxine (SYNTHROID, LEVOTHROID) 75 MCG tablet Take 75 mcg by mouth daily before breakfast.     ondansetron (ZOFRAN) 4 MG tablet Take 1 tablet (4 mg total) by mouth every 8 (eight) hours as needed for nausea or vomiting. 30 tablet 1   ondansetron (ZOFRAN) 4 MG tablet Take 1 tablet (4 mg total) by mouth every 8 (eight) hours as needed for nausea or vomiting. 20 tablet 0   No current facility-administered medications for this encounter.    Physical Findings: The patient is in no acute distress. Patient is alert and oriented.  vitals were not taken for this visit. .  No significant changes. Lungs are clear to auscultation bilaterally. Heart has regular rate and rhythm. No palpable cervical, supraclavicular, or axillary adenopathy. Abdomen soft, non-tender, normal bowel sounds.  Right Breast: no palpable mass, nipple discharge or bleeding. Left Breast: ***    Lab Findings: Lab Results  Component Value Date   WBC 4.2 04/13/2015   HGB 12.6 04/13/2015   HCT 38.4 04/13/2015   MCV 93.1 04/13/2015   PLT 220 04/13/2015    Radiographic Findings: No results found.  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  ***  Plan:  ***   ***  minutes of total time was spent for this patient encounter, including preparation, face-to-face counseling with the patient and coordination of care, physical exam, and documentation of the encounter. ____________________________________  Blair Promise, PhD, MD  This document serves as a record of services personally performed by Gery Pray, MD. It was created on his behalf by Roney Mans, a trained medical scribe. The creation of this record is based on the scribe's personal observations and the provider's statements to them. This document has been checked and approved by the attending provider.

## 2022-01-11 ENCOUNTER — Ambulatory Visit
Admission: RE | Admit: 2022-01-11 | Discharge: 2022-01-11 | Disposition: A | Discharge status: Home / Self Care | Payer: Medicare Other | Source: Ambulatory Visit | Attending: Radiation Oncology | Admitting: Radiation Oncology

## 2022-01-12 ENCOUNTER — Telehealth: Payer: Self-pay | Admitting: *Deleted

## 2022-01-12 NOTE — Telephone Encounter (Signed)
CALLED PATIENT TO RESCHEDULE MISSED FU APPT. FOR 01-11-22, NO ANSWER

## 2022-01-24 ENCOUNTER — Telehealth: Payer: Self-pay | Admitting: *Deleted

## 2022-01-24 NOTE — Telephone Encounter (Signed)
RETURNED PATIENT'S PHONE CALL, SPOKE WITH PATIENT. ?

## 2022-02-14 ENCOUNTER — Telehealth: Payer: Self-pay | Admitting: Radiology

## 2022-02-14 ENCOUNTER — Other Ambulatory Visit: Payer: Self-pay | Admitting: Radiation Oncology

## 2022-02-14 MED ORDER — HYDROCODONE-ACETAMINOPHEN 5-325 MG PO TABS: 1.0000 | ORAL_TABLET | ORAL | 0 refills | Status: DC | PRN

## 2022-02-14 NOTE — Telephone Encounter (Signed)
Patient requests refill of hydrocodone-acetaminophen to be sent to [facility] on [address]

## 2022-03-04 NOTE — Progress Notes (Signed)
?Radiation Oncology         [phone] ?________________________________ ? ?Name: Carolyn Garcia MRN: [id number]  ?Date: 03/05/2022  DOB: 1966-10-06 ? ?Follow-Up Visit Note ? ?CC: [name], FNP  Tommy Medal, MD ? ?  ICD-10-CM   ?1. Malignant neoplasm of upper-outer quadrant of left female breast, unspecified estrogen receptor status ([place])  C50.412 CBC with Differential ([facility] Only)  ?  CMP ([facility] only)  ?  T4  ?  TSH  ?  CT Chest W Contrast  ?  ? ? 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 ?Interval Since Last Radiation: 18 years, 9 months, and 22 days  ? ?Radiation Treatment Dates: 03/29/2003 - 05/12/2003 ?  ?Site: Adjuvant radiation therapy to the left breast. ? ?Narrative:  The patient returns today for routine follow-up, she was last seen here for follow up on 07/03/21.  Pertinent imaging performed since the patient was last seen includes a whole body bone scan on 08/29/21 which showed focal radiotracer accumulation within an anterior right rib; noted as possibly representative of a site of metastatic disease or trauma. No additional sites of uptake were identified suggestive of osseous metastatic disease.  ? ?She reports injuring her right anterior chest inferior to her breast approximately 1 to 2 months prior to this bone scan.  This may explain the uptake on bone scan but still potentially could be metastatic disease.  She is also been having some pain along the left anterior rib cage inferior to the left breast.  She also reports pain in mid thoracic spine area.  In addition she reports not feeling her usual self with lack of energy and poor appetite.  She reports having some weight loss due to her poor appetite.  She continues to have a stressful social life with taking care of her aging parents.  She feels this may be contributing to some of her anxiety and fatigue but still just feels not her usual self.  She also  reports not wanting to leave the house.  I discussed with her that this  could be some depression and that she should consider consulting with her primary care physician.  I also discussed that some of this fatigue could be related to other issues and blood work may help to address some of those issues.  She denies any nipple discharge or bleeding.  She did miss her mammograms and have stressed the importance of regular follow-up for these.  She will call to reschedule these appointments. ?                      ? ?Allergies:  is allergic to entex, tyloxapol, venlafaxine, and phenylephrine-guaifenesin. ? ?Meds: ?Current Outpatient Medications  ?Medication Sig Dispense Refill  ? ALPRAZolam (XANAX) 0.25 MG tablet TAKE 1 TABLET BY MOUTH EVERY 6 HOURS AS NEEDED FOR ANXIETY.  Called in to [facility] on [address] per Dr. Sondra Come.    ? Biotin 5 MG TBDP Take 1 tablet by mouth daily.    ? cholecalciferol (VITAMIN D) 1000 UNITS tablet Take 2,000 Units by mouth daily.    ? cyclobenzaprine (FLEXERIL) 10 MG tablet Take 10 mg by mouth daily as needed.     ? Golimumab 50 MG/0.5ML SOLN Inject 50 mg into the skin every 30 (thirty) days.    ? HYDROcodone-acetaminophen (NORCO/VICODIN) 5-325 MG tablet Take 1-2 tablets by mouth as needed for moderate pain (Q4-6 hrs). 360 tablet 0  ?  levocetirizine (XYZAL) 5 MG tablet   5  ? levothyroxine (SYNTHROID, LEVOTHROID) 75 MCG tablet Take 75 mcg by mouth daily before breakfast.    ? ondansetron (ZOFRAN) 4 MG tablet Take 1 tablet (4 mg total) by mouth every 8 (eight) hours as needed for nausea or vomiting. (Patient not taking: Reported on 03/05/2022) 20 tablet 0  ? ?No current facility-administered medications for this encounter.  ? ? ?Physical Findings: ?The patient is in no acute distress. Patient is alert and oriented. ? height is '5\' 5"'$  (1.651 m) and weight is 119 lb (54 kg). Her temporal temperature is 97.1 ?F (36.2 ?C) (abnormal). Her pulse is 84. Her respiration is 18 and oxygen saturation is  99%. .  No significant changes. Lungs are clear to auscultation bilaterally. Heart has regular rate and rhythm. No palpable cervical, supraclavicular, or axillary adenopathy. Abdomen soft, non-tender, normal bowel sounds. ? ?Right Breast: no palpable mass, nipple discharge or bleeding. ?Left Breast: Mild architectural distortion at the lumpectomy site.  No palpable mass nipple discharge or bleeding.  She seems to be somewhat less sensitive with exam today.  She continues to have some tenderness with palpation in the left axillary region near her scar. ? ? ?Lab Findings: ?Lab Results  ?Component Value Date  ? WBC 4.2 04/13/2015  ? HGB 12.6 04/13/2015  ? HCT 38.4 04/13/2015  ? MCV 93.1 04/13/2015  ? PLT 220 04/13/2015  ? ? ?Radiographic Findings: ?No results found. ? 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 ?We discussed obtaining a chest CT scan as recommended by radiology to further assess whether this up-to-date: Bone scan is related to trauma or metastatic disease.  I also discussed with her that this may also evaluate the left anterior chest region as well as her mid thoracic spine area. ? ?She does wish to proceed with routine blood work for further evaluation.  I discussed with her if there is anything abnormal that she will need to see her primary care physician for medication concerning these potential issues. ? ?Plan: Blood work ordered today.  CT scan of the chest with contrast ordered for approximately 2 weeks from now.  Further management depends on results of this chest CT scan.  Again encouraged her to follow-up with her Primary care physician.  She is scheduled to see her rheumatologist later this month in addition. ? ? ?25 minutes of total time was spent for this patient encounter, including preparation, face-to-face counseling with the patient and coordination of care, physical exam, and documentation of the  encounter. ?____________________________________ ? ?Blair Promise, PhD, MD ? ? ?This document serves as a record of services personally performed by Gery Pray, MD. It was created on his behalf by Roney Mans, a trained medical scribe. The creation of this record is based on the scribe's personal observations and the provider's statements to them. This document has been checked and approved by the attending provider. ? ?

## 2022-03-05 ENCOUNTER — Encounter: Payer: Self-pay | Admitting: Radiation Oncology

## 2022-03-05 ENCOUNTER — Other Ambulatory Visit: Payer: Self-pay

## 2022-03-05 ENCOUNTER — Ambulatory Visit
Admission: RE | Admit: 2022-03-05 | Discharge: 2022-03-05 | Disposition: A | Discharge status: Home / Self Care | Payer: Medicare Other | Source: Ambulatory Visit | Attending: Radiation Oncology | Admitting: Radiation Oncology

## 2022-03-05 DIAGNOSIS — Z923 Personal history of irradiation: Secondary | ICD-10-CM | POA: Diagnosis not present

## 2022-03-05 DIAGNOSIS — R5383 Other fatigue: Secondary | ICD-10-CM | POA: Insufficient documentation

## 2022-03-05 DIAGNOSIS — R630 Anorexia: Secondary | ICD-10-CM | POA: Insufficient documentation

## 2022-03-05 DIAGNOSIS — Z853 Personal history of malignant neoplasm of breast: Secondary | ICD-10-CM | POA: Insufficient documentation

## 2022-03-05 DIAGNOSIS — R937 Abnormal findings on diagnostic imaging of other parts of musculoskeletal system: Secondary | ICD-10-CM | POA: Insufficient documentation

## 2022-03-05 DIAGNOSIS — M546 Pain in thoracic spine: Secondary | ICD-10-CM | POA: Diagnosis not present

## 2022-03-05 DIAGNOSIS — Z08 Encounter for follow-up examination after completed treatment for malignant neoplasm: Secondary | ICD-10-CM | POA: Diagnosis not present

## 2022-03-05 DIAGNOSIS — Z79899 Other long term (current) drug therapy: Secondary | ICD-10-CM | POA: Diagnosis not present

## 2022-03-05 DIAGNOSIS — C50412 Malignant neoplasm of upper-outer quadrant of left female breast: Secondary | ICD-10-CM

## 2022-03-05 DIAGNOSIS — R634 Abnormal weight loss: Secondary | ICD-10-CM | POA: Diagnosis not present

## 2022-03-05 NOTE — Progress Notes (Signed)
Nikeria Waylynn Benefiel is here today for follow up post radiation to the breast. ? ? Breast Side:left ? ? ?They completed their radiation on: 04/2003 ? ?Does the patient complain of any of the following: ?Post radiation skin issues: Skin remains intact ?Breast Tenderness: Continues to have tenderness to bilateral breast and left axilla.  ?Breast Swelling: no ?Lymphedema: no ?Range of Motion limitations: patient reports having limited range of motion. ?Fatigue post radiation: yes ?Appetite good/fair/poor: fair ? ?Additional comments if applicable: Patient reports having a fall today.  ? ?Vitals:  ? 03/05/22 1619  ?Pulse: 84  ?Resp: 18  ?Temp: (!) 97.1 ?F (36.2 ?C)  ?TempSrc: Temporal  ?SpO2: 99%  ?Weight: 119 lb (54 kg)  ?Height: '5\' 5"'$  (1.651 m)  ?  ?

## 2022-03-16 ENCOUNTER — Telehealth: Payer: Self-pay | Admitting: *Deleted

## 2022-03-16 NOTE — Telephone Encounter (Signed)
RETURNED PATIENT'S PHONE CALL, SPOKE WITH PATIENT. ?

## 2022-03-21 ENCOUNTER — Other Ambulatory Visit: Payer: Self-pay

## 2022-03-21 ENCOUNTER — Ambulatory Visit
Admission: RE | Admit: 2022-03-21 | Discharge: 2022-03-21 | Disposition: A | Discharge status: Home / Self Care | Payer: Medicare Other | Source: Ambulatory Visit | Attending: Radiation Oncology | Admitting: Radiation Oncology

## 2022-03-21 DIAGNOSIS — Z853 Personal history of malignant neoplasm of breast: Secondary | ICD-10-CM | POA: Diagnosis not present

## 2022-03-21 DIAGNOSIS — E039 Hypothyroidism, unspecified: Secondary | ICD-10-CM | POA: Insufficient documentation

## 2022-03-21 DIAGNOSIS — C50412 Malignant neoplasm of upper-outer quadrant of left female breast: Secondary | ICD-10-CM

## 2022-03-21 LAB — CMP (CANCER CENTER ONLY)
ALT: 13 U/L (ref 0–44)
AST: 20 U/L (ref 15–41)
Albumin: 4.3 g/dL (ref 3.5–5.0)
Alkaline Phosphatase: 67 U/L (ref 38–126)
Anion gap: 9 (ref 5–15)
BUN: 9 mg/dL (ref 6–20)
CO2: 27 mmol/L (ref 22–32)
Calcium: 9.6 mg/dL (ref 8.9–10.3)
Chloride: 103 mmol/L (ref 98–111)
Creatinine: 0.64 mg/dL (ref 0.44–1.00)
GFR, Estimated: >60 mL/min (ref >60)
Glucose, Bld: 85 mg/dL (ref 70–99)
Potassium: 3 mmol/L — ABNORMAL LOW (ref 3.5–5.1)
Sodium: 139 mmol/L (ref 135–145)
Total Bilirubin: 0.2 mg/dL — ABNORMAL LOW (ref 0.3–1.2)
Total Protein: 7.8 g/dL (ref 6.5–8.1)

## 2022-03-21 LAB — CBC WITH DIFFERENTIAL (CANCER CENTER ONLY)
Abs Immature Granulocytes: 0.01 10*3/uL (ref 0.00–0.07)
Basophils Absolute: 0.1 10*3/uL (ref 0.0–0.1)
Basophils Relative: 1 %
Eosinophils Absolute: 0.2 10*3/uL (ref 0.0–0.5)
Eosinophils Relative: 3 %
HCT: 36.6 % (ref 36.0–46.0)
Hemoglobin: 12.5 g/dL (ref 12.0–15.0)
Immature Granulocytes: 0 %
Lymphocytes Relative: 53 %
Lymphs Abs: 3.2 10*3/uL (ref 0.7–4.0)
MCH: 30.3 pg (ref 26.0–34.0)
MCHC: 34.2 g/dL (ref 30.0–36.0)
MCV: 88.8 fL (ref 80.0–100.0)
Monocytes Absolute: 0.5 10*3/uL (ref 0.1–1.0)
Monocytes Relative: 8 %
Neutro Abs: 2.1 10*3/uL (ref 1.7–7.7)
Neutrophils Relative %: 35 %
Platelet Count: 232 10*3/uL (ref 150–400)
RBC: 4.12 MIL/uL (ref 3.87–5.11)
RDW: 12.2 % (ref 11.5–15.5)
WBC Count: 6.1 10*3/uL (ref 4.0–10.5)
nRBC: 0 % (ref 0.0–0.2)

## 2022-03-22 LAB — T4: T4, Total: 11.9 ug/dL (ref 4.5–12.0)

## 2022-03-22 LAB — TSH: TSH: <0.08 u[IU]/mL — ABNORMAL LOW (ref 0.308–3.960)

## 2022-03-26 ENCOUNTER — Telehealth: Payer: Self-pay | Admitting: Radiology

## 2022-03-26 NOTE — Telephone Encounter (Signed)
Per Dr Sondra Come, notified patient that TSH and potassium levels were low and to contact PCP regarding this. Recommended eating potassium rich foods and gave examples. Patient verbalized understanding. ?

## 2022-04-26 ENCOUNTER — Other Ambulatory Visit: Payer: Self-pay | Admitting: Radiation Oncology

## 2022-04-26 ENCOUNTER — Telehealth: Payer: Self-pay | Admitting: Radiology

## 2022-04-26 MED ORDER — HYDROCODONE-ACETAMINOPHEN 5-325 MG PO TABS: 1.0000 | ORAL_TABLET | ORAL | 0 refills | Status: DC | PRN

## 2022-04-26 NOTE — Telephone Encounter (Signed)
Patient would like a refill of hydrocodone called to [facility] at [address]. ?

## 2022-07-09 ENCOUNTER — Telehealth: Payer: Self-pay | Admitting: Oncology

## 2022-07-09 NOTE — Telephone Encounter (Signed)
Carolyn Garcia called and requested a refill of hydrocodone/acetaminophen. She has 2 weeks supply left and would like the refill sent to [facility] on [address].

## 2022-07-10 ENCOUNTER — Other Ambulatory Visit: Payer: Self-pay | Admitting: Radiation Oncology

## 2022-07-10 MED ORDER — HYDROCODONE-ACETAMINOPHEN 5-325 MG PO TABS: 1.0000 | ORAL_TABLET | ORAL | 0 refills | Status: DC | PRN

## 2022-07-10 NOTE — Telephone Encounter (Signed)
Carolyn Garcia called back and was advised that her refill has been sent.  She also reports feeling very fatigued and said it ihas gotten worse.  She is wondering if she can have labs done to check her blood counts and thyroid function and then see Dr. Sondra Come the same day.  Scheduled labs and follow up on 07/23/22.  She verbalized understanding and agreement of appointments.

## 2022-07-19 ENCOUNTER — Other Ambulatory Visit: Payer: Self-pay | Admitting: Oncology

## 2022-07-19 DIAGNOSIS — C50412 Malignant neoplasm of upper-outer quadrant of left female breast: Secondary | ICD-10-CM

## 2022-07-19 NOTE — Progress Notes (Signed)
Lab orders placed per Dr. Sondra Come for appointment on 07/23/22.

## 2022-07-22 NOTE — Progress Notes (Signed)
  Radiation Oncology         [phone] ________________________________  Name: Carolyn Garcia MRN: [id number]  Date: 07/23/2022  DOB: August 17, 1966  Follow-Up Visit Note  CC: [name], FNP  [name], FNP  No diagnosis found.  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  Narrative:  The patient returns today for routine follow-up, she was last seen here for follow up on 03/05/22. No significant interval history since was last seen.   ***  Allergies:  is allergic to entex, tyloxapol, venlafaxine, and phenylephrine-guaifenesin.  Meds: Current Outpatient Medications  Medication Sig Dispense Refill   ALPRAZolam (XANAX) 0.25 MG tablet TAKE 1 TABLET BY MOUTH EVERY 6 HOURS AS NEEDED FOR ANXIETY.  Called in to [facility] on [address] per Dr. Sondra Come.     Biotin 5 MG TBDP Take 1 tablet by mouth daily.     cholecalciferol (VITAMIN D) 1000 UNITS tablet Take 2,000 Units by mouth daily.     cyclobenzaprine (FLEXERIL) 10 MG tablet Take 10 mg by mouth daily as needed.      Golimumab 50 MG/0.5ML SOLN Inject 50 mg into the skin every 30 (thirty) days.     HYDROcodone-acetaminophen (NORCO/VICODIN) 5-325 MG tablet Take 1-2 tablets by mouth as needed for moderate pain (Q4-6 hrs). 360 tablet 0   levocetirizine (XYZAL) 5 MG tablet   5   levothyroxine (SYNTHROID, LEVOTHROID) 75 MCG tablet Take 75 mcg by mouth daily before breakfast.     ondansetron (ZOFRAN) 4 MG tablet Take 1 tablet (4 mg total) by mouth every 8 (eight) hours as needed for nausea or vomiting. (Patient not taking: Reported on 03/05/2022) 20 tablet 0   No current facility-administered medications for this encounter.    Physical Findings: The patient is in no acute distress. Patient is alert  and oriented.  vitals were not taken for this visit. .  No significant changes. Lungs are clear to auscultation bilaterally. Heart has regular rate and rhythm. No palpable cervical, supraclavicular, or axillary adenopathy. Abdomen soft, non-tender, normal bowel sounds.  Right Breast: no palpable mass, nipple discharge or bleeding. Left Breast: ***   Lab Findings: Lab Results  Component Value Date   WBC 6.1 03/21/2022   HGB 12.5 03/21/2022   HCT 36.6 03/21/2022   MCV 88.8 03/21/2022   PLT 232 03/21/2022    Radiographic Findings: No results found.  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  ***  Plan:  ***   *** minutes of total time was spent for this patient encounter, including preparation, face-to-face counseling with the patient and coordination of care, physical exam, and documentation of the encounter. ____________________________________  Blair Promise, PhD, MD  This document serves as a record of services personally performed by Gery Pray, MD. It was created on his behalf by Roney Mans, a trained medical scribe. The creation of this record is based on the scribe's personal observations and the provider's statements to them. This document has been checked and approved by the attending provider.

## 2022-07-23 ENCOUNTER — Ambulatory Visit
Admission: RE | Admit: 2022-07-23 | Discharge: 2022-07-23 | Disposition: A | Discharge status: Home / Self Care | Payer: Medicare Other | Source: Ambulatory Visit | Attending: Radiation Oncology | Admitting: Radiation Oncology

## 2022-07-23 ENCOUNTER — Ambulatory Visit
Admission: RE | Admit: 2022-07-23 | Discharge: 2022-07-23 | Disposition: A | Discharge status: Home / Self Care | Payer: Medicare Other | Source: Ambulatory Visit

## 2022-07-23 ENCOUNTER — Encounter: Payer: Self-pay | Admitting: Radiation Oncology

## 2022-07-23 ENCOUNTER — Other Ambulatory Visit: Payer: Self-pay

## 2022-07-23 DIAGNOSIS — Z923 Personal history of irradiation: Secondary | ICD-10-CM | POA: Insufficient documentation

## 2022-07-23 DIAGNOSIS — R5383 Other fatigue: Secondary | ICD-10-CM | POA: Insufficient documentation

## 2022-07-23 DIAGNOSIS — Z170 Estrogen receptor positive status [ER+]: Secondary | ICD-10-CM | POA: Insufficient documentation

## 2022-07-23 DIAGNOSIS — C50412 Malignant neoplasm of upper-outer quadrant of left female breast: Secondary | ICD-10-CM | POA: Insufficient documentation

## 2022-07-23 DIAGNOSIS — R634 Abnormal weight loss: Secondary | ICD-10-CM | POA: Insufficient documentation

## 2022-07-23 DIAGNOSIS — C7951 Secondary malignant neoplasm of bone: Secondary | ICD-10-CM | POA: Diagnosis not present

## 2022-07-23 DIAGNOSIS — Z79890 Hormone replacement therapy (postmenopausal): Secondary | ICD-10-CM | POA: Insufficient documentation

## 2022-07-23 LAB — CBC WITH DIFFERENTIAL (CANCER CENTER ONLY)
Abs Immature Granulocytes: 0 10*3/uL (ref 0.00–0.07)
Basophils Absolute: 0.1 10*3/uL (ref 0.0–0.1)
Basophils Relative: 1 %
Eosinophils Absolute: 0.1 10*3/uL (ref 0.0–0.5)
Eosinophils Relative: 2 %
HCT: 36.3 % (ref 36.0–46.0)
Hemoglobin: 12.3 g/dL (ref 12.0–15.0)
Immature Granulocytes: 0 %
Lymphocytes Relative: 48 %
Lymphs Abs: 2.2 10*3/uL (ref 0.7–4.0)
MCH: 31.1 pg (ref 26.0–34.0)
MCHC: 33.9 g/dL (ref 30.0–36.0)
MCV: 91.9 fL (ref 80.0–100.0)
Monocytes Absolute: 0.3 10*3/uL (ref 0.1–1.0)
Monocytes Relative: 7 %
Neutro Abs: 1.9 10*3/uL (ref 1.7–7.7)
Neutrophils Relative %: 42 %
Platelet Count: 208 10*3/uL (ref 150–400)
RBC: 3.95 MIL/uL (ref 3.87–5.11)
RDW: 12.4 % (ref 11.5–15.5)
WBC Count: 4.5 10*3/uL (ref 4.0–10.5)
nRBC: 0 % (ref 0.0–0.2)

## 2022-07-23 LAB — CMP (CANCER CENTER ONLY)
ALT: 11 U/L (ref 0–44)
AST: 17 U/L (ref 15–41)
Albumin: 4.3 g/dL (ref 3.5–5.0)
Alkaline Phosphatase: 65 U/L (ref 38–126)
Anion gap: 6 (ref 5–15)
BUN: 8 mg/dL (ref 6–20)
CO2: 30 mmol/L (ref 22–32)
Calcium: 9.3 mg/dL (ref 8.9–10.3)
Chloride: 104 mmol/L (ref 98–111)
Creatinine: 0.76 mg/dL (ref 0.44–1.00)
GFR, Estimated: >60 mL/min (ref >60)
Glucose, Bld: 69 mg/dL — ABNORMAL LOW (ref 70–99)
Potassium: 3.1 mmol/L — ABNORMAL LOW (ref 3.5–5.1)
Sodium: 140 mmol/L (ref 135–145)
Total Bilirubin: 0.5 mg/dL (ref 0.3–1.2)
Total Protein: 7.1 g/dL (ref 6.5–8.1)

## 2022-07-23 NOTE — Progress Notes (Signed)
Carolyn Garcia is here today for follow up post radiation to the breast.   Breast Side:Left   They completed their radiation on: 0519/23   Does the patient complain of any of the following: Post radiation skin issues: No Breast Tenderness: Yes, patient continues to have tenderness and pain to bilateral breast.  Breast Swelling: Patient states left breast feels puffy at times.  Lymphadema: Patient  reports tightness to left axilla.  Range of Motion limitations: Limited range motion.  Fatigue post radiation: Yes, report extreme fatigue.   Appetite good/fair/poor: Fair  Additional comments if applicable:   BP 546/50 (BP Location: Right Arm, Patient Position: Sitting)   Pulse 87   Temp (!) 96.9 F (36.1 C) (Temporal)   Resp 18   Ht '5\' 5"'$  (1.651 m)   Wt 111 lb 4 oz (50.5 kg)   SpO2 100%   BMI 18.51 kg/m

## 2022-07-24 ENCOUNTER — Telehealth: Payer: Self-pay | Admitting: *Deleted

## 2022-07-24 ENCOUNTER — Telehealth: Payer: Self-pay | Admitting: Oncology

## 2022-07-24 LAB — T4: T4, Total: 9 ug/dL (ref 4.5–12.0)

## 2022-07-24 LAB — TSH: TSH: 0.327 u[IU]/mL — ABNORMAL LOW (ref 0.350–4.500)

## 2022-07-24 NOTE — Telephone Encounter (Signed)
Carolyn Garcia called and wanted to make sure that the places on her right rib and spine will be included in her upcoming CT scans.  Reassured her that they will be evaluated with the scans.

## 2022-07-24 NOTE — Telephone Encounter (Signed)
CALLED PATIENT TO INFORM OF CT FOR 08-16-22 - ARRIVAL TIME- 3 PM @ [place],. PATIENT TO HAVE WATER ONLY - 4 HRS. PRIOR TO TEST, PATIENT TO PICK UP CONTRAST DAY BEFORE SCAN , PATIENT TO REPORT TO [address] FOR MAMMOGRAM - ARRIVAL TIME- 4:20 PM, LVM FOR A RETURN CALL

## 2022-07-26 ENCOUNTER — Telehealth: Payer: Self-pay | Admitting: *Deleted

## 2022-07-26 NOTE — Telephone Encounter (Signed)
CALLED PATIENT TO INFORM OF SCANS, LVM FOR A RETURN CALL

## 2022-07-27 ENCOUNTER — Telehealth: Payer: Self-pay | Admitting: *Deleted

## 2022-07-27 NOTE — Telephone Encounter (Signed)
RETURNED PATIENT'S PHONE CALL, LVM FOR A RETURN CALL 

## 2022-07-30 ENCOUNTER — Telehealth: Payer: Self-pay | Admitting: *Deleted

## 2022-07-30 NOTE — Telephone Encounter (Signed)
RETURNED PATIENT'S PHONE CALL, LVM FOR A RETURN CALL 

## 2022-08-01 ENCOUNTER — Telehealth: Payer: Self-pay | Admitting: *Deleted

## 2022-08-01 NOTE — Telephone Encounter (Signed)
RETURNED PATIENT'S PHONE CALL, LVM FOR A RETURN CALL 

## 2022-08-15 ENCOUNTER — Telehealth: Payer: Self-pay | Admitting: *Deleted

## 2022-08-15 ENCOUNTER — Telehealth: Payer: Self-pay | Admitting: Oncology

## 2022-08-15 NOTE — Telephone Encounter (Signed)
RETURNED PATIENT'S PHONE CALL, LVM FOR A RETURN CALL 

## 2022-08-15 NOTE — Telephone Encounter (Signed)
Bianco left a message asking for her thyroid lab results from 07/23/22.  Called her back and left a message with her T4 and TSH results.

## 2022-08-16 ENCOUNTER — Ambulatory Visit
Admission: RE | Admit: 2022-08-16 | Discharge: 2022-08-16 | Disposition: A | Discharge status: Home / Self Care | Payer: Medicare Other | Source: Ambulatory Visit | Attending: Radiation Oncology | Admitting: Radiation Oncology

## 2022-08-16 ENCOUNTER — Ambulatory Visit: Payer: Medicare Other

## 2022-08-16 DIAGNOSIS — C50912 Malignant neoplasm of unspecified site of left female breast: Secondary | ICD-10-CM | POA: Diagnosis not present

## 2022-08-16 DIAGNOSIS — I700 Atherosclerosis of aorta: Secondary | ICD-10-CM | POA: Diagnosis not present

## 2022-08-16 DIAGNOSIS — C50412 Malignant neoplasm of upper-outer quadrant of left female breast: Secondary | ICD-10-CM

## 2022-08-16 DIAGNOSIS — Z1231 Encounter for screening mammogram for malignant neoplasm of breast: Secondary | ICD-10-CM | POA: Diagnosis not present

## 2022-08-16 DIAGNOSIS — E890 Postprocedural hypothyroidism: Secondary | ICD-10-CM | POA: Diagnosis not present

## 2022-08-16 DIAGNOSIS — R634 Abnormal weight loss: Secondary | ICD-10-CM | POA: Diagnosis not present

## 2022-08-16 DIAGNOSIS — Z5111 Encounter for antineoplastic chemotherapy: Secondary | ICD-10-CM | POA: Diagnosis not present

## 2022-08-16 DIAGNOSIS — J479 Bronchiectasis, uncomplicated: Secondary | ICD-10-CM | POA: Diagnosis not present

## 2022-08-16 MED ORDER — IOPAMIDOL (ISOVUE-300) INJECTION 61%
100.0000 mL | Freq: Once | INTRAVENOUS | Status: AC | PRN
  Administered 2022-08-16: 100 mL via INTRAVENOUS

## 2022-08-22 DIAGNOSIS — E039 Hypothyroidism, unspecified: Secondary | ICD-10-CM | POA: Diagnosis not present

## 2022-08-22 DIAGNOSIS — E559 Vitamin D deficiency, unspecified: Secondary | ICD-10-CM | POA: Diagnosis not present

## 2022-08-22 DIAGNOSIS — M25521 Pain in right elbow: Secondary | ICD-10-CM | POA: Diagnosis not present

## 2022-08-24 ENCOUNTER — Telehealth: Payer: Self-pay | Admitting: *Deleted

## 2022-08-24 NOTE — Telephone Encounter (Signed)
CALLED PATIENT TO INFORM OF FU APPT. WITH DR. KINARD ON 02/21/23 @ 4 PM, LVM FOR A RETURN CALL

## 2022-09-12 ENCOUNTER — Other Ambulatory Visit: Payer: Self-pay | Admitting: Radiation Oncology

## 2022-09-12 ENCOUNTER — Telehealth: Payer: Self-pay | Admitting: Oncology

## 2022-09-12 MED ORDER — HYDROCODONE-ACETAMINOPHEN 5-325 MG PO TABS: 1.0000 | ORAL_TABLET | ORAL | 0 refills | Status: DC | PRN

## 2022-09-12 NOTE — Telephone Encounter (Signed)
Carolyn Garcia called and requested a refill of hydrocodone acetaminophen to be sent to the [facility] on [address]. She has 30 tablets left.  She also asked if Dr. Sondra Come would be willing to order an MRI of her spine in the future.

## 2022-09-13 NOTE — Telephone Encounter (Signed)
Called [place] and let her know the refills has been sent.

## 2022-09-17 DIAGNOSIS — H5203 Hypermetropia, bilateral: Secondary | ICD-10-CM | POA: Diagnosis not present

## 2022-09-17 DIAGNOSIS — H04123 Dry eye syndrome of bilateral lacrimal glands: Secondary | ICD-10-CM | POA: Diagnosis not present

## 2022-09-20 DIAGNOSIS — E7800 Pure hypercholesterolemia, unspecified: Secondary | ICD-10-CM | POA: Diagnosis not present

## 2022-09-20 DIAGNOSIS — E559 Vitamin D deficiency, unspecified: Secondary | ICD-10-CM | POA: Diagnosis not present

## 2022-09-20 DIAGNOSIS — Z0000 Encounter for general adult medical examination without abnormal findings: Secondary | ICD-10-CM | POA: Diagnosis not present

## 2022-09-20 DIAGNOSIS — E039 Hypothyroidism, unspecified: Secondary | ICD-10-CM | POA: Diagnosis not present

## 2022-09-27 DIAGNOSIS — E039 Hypothyroidism, unspecified: Secondary | ICD-10-CM | POA: Diagnosis not present

## 2022-09-27 DIAGNOSIS — N3000 Acute cystitis without hematuria: Secondary | ICD-10-CM | POA: Diagnosis not present

## 2022-09-27 DIAGNOSIS — M059 Rheumatoid arthritis with rheumatoid factor, unspecified: Secondary | ICD-10-CM | POA: Diagnosis not present

## 2022-09-27 DIAGNOSIS — E7800 Pure hypercholesterolemia, unspecified: Secondary | ICD-10-CM | POA: Diagnosis not present

## 2022-09-27 DIAGNOSIS — Z23 Encounter for immunization: Secondary | ICD-10-CM | POA: Diagnosis not present

## 2022-09-27 DIAGNOSIS — M810 Age-related osteoporosis without current pathological fracture: Secondary | ICD-10-CM | POA: Diagnosis not present

## 2022-09-27 DIAGNOSIS — Z853 Personal history of malignant neoplasm of breast: Secondary | ICD-10-CM | POA: Diagnosis not present

## 2022-09-27 DIAGNOSIS — Z0000 Encounter for general adult medical examination without abnormal findings: Secondary | ICD-10-CM | POA: Diagnosis not present

## 2022-10-04 ENCOUNTER — Telehealth: Payer: Self-pay | Admitting: Oncology

## 2022-10-04 NOTE — Telephone Encounter (Signed)
Called Carolyn Garcia regarding her request for an MRI of her spine in the future. She is currently having pain in her neck, thoracic, lumbar and sacral areas.  She is wondering if she can have as many of these areas included in the MRI as possible.  She is not ready to have the MRI scheduled and will call back next week to let us know if she would like to proceed with scheduling.

## 2022-11-19 ENCOUNTER — Other Ambulatory Visit: Payer: Self-pay | Admitting: Radiation Oncology

## 2022-11-19 ENCOUNTER — Telehealth: Payer: Self-pay

## 2022-11-19 MED ORDER — HYDROCODONE-ACETAMINOPHEN 5-325 MG PO TABS: 1.0000 | ORAL_TABLET | ORAL | 0 refills | Status: DC | PRN

## 2022-11-19 NOTE — Telephone Encounter (Signed)
Received a call from patient stating that she needs  a refill on her Hydrocodone-apap 5/'325mg'$ . Patient requesting medication be sent to [facility] on [address].

## 2022-11-22 DIAGNOSIS — E039 Hypothyroidism, unspecified: Secondary | ICD-10-CM | POA: Diagnosis not present

## 2022-11-27 DIAGNOSIS — E039 Hypothyroidism, unspecified: Secondary | ICD-10-CM | POA: Diagnosis not present

## 2023-01-21 ENCOUNTER — Telehealth: Payer: Self-pay

## 2023-01-21 ENCOUNTER — Other Ambulatory Visit: Payer: Self-pay | Admitting: Radiation Oncology

## 2023-01-21 MED ORDER — HYDROCODONE-ACETAMINOPHEN 5-325 MG PO TABS: 1.0000 | ORAL_TABLET | ORAL | 0 refills | Status: DC | PRN

## 2023-01-21 NOTE — Telephone Encounter (Signed)
Patient called in requesting refill on hydrocodone-apap 5-'325mg'$ . Patient requesting refill be sent to [place].

## 2023-02-20 NOTE — Progress Notes (Signed)
Radiation Oncology         [phone] ________________________________  Name: Carolyn Garcia MRN: [id number]  Date: 02/21/2023  DOB: 1966/05/21  Follow-Up Visit Note  CC: [name], FNP  [name], FNP  No diagnosis found.  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  Narrative:  The patient returns today for routine 6 month follow-up. She was last seen here for follow-up on 07/23/22. To review from her last follow-up visit, the patient endorsed significant interval weight loss and fatigue. In light of her symptoms, a CT CAP was ordered to rule out metastatic disease. Subsequent CT CAP on 08/16/22 showed no evidence of lymphadenopathy or metastatic disease in the chest, abdomen, or pelvis. CT also showed no correlating findings to account for a previously seen radiotracer avid focus in the right 5th rib. Blood work was also ordered at her last visit which was unremarkable other than slight low TSH at .327.   Other pertinent imaging performed in the interval includes a bilateral screening mammogram on 08/16/22 which showed no evidence of malignancy in either breast.              ***                 Allergies:  is allergic to entex, tyloxapol, venlafaxine, and phenylephrine-guaifenesin.  Meds: Current Outpatient Medications  Medication Sig Dispense Refill   ALPRAZolam (XANAX) 0.25 MG tablet TAKE 1 TABLET BY MOUTH EVERY 6 HOURS AS NEEDED FOR ANXIETY.  Called in to [facility] on [address] per Dr. Sondra Come.     Biotin 5 MG TBDP Take 1 tablet by mouth daily.     cholecalciferol (VITAMIN D) 1000 UNITS tablet Take 2,000 Units by mouth daily.     cyclobenzaprine (FLEXERIL) 10 MG tablet Take 10 mg by mouth daily as needed.      Golimumab 50  MG/0.5ML SOLN Inject 50 mg into the skin every 30 (thirty) days. (Patient not taking: Reported on 07/23/2022)     HYDROcodone-acetaminophen (NORCO/VICODIN) 5-325 MG tablet Take 1-2 tablets by mouth as needed for moderate pain (Q4-6 hrs). 360 tablet 0   levocetirizine (XYZAL) 5 MG tablet   5   levothyroxine (SYNTHROID, LEVOTHROID) 75 MCG tablet Take 75 mcg by mouth daily before breakfast.     ondansetron (ZOFRAN) 4 MG tablet Take 1 tablet (4 mg total) by mouth every 8 (eight) hours as needed for nausea or vomiting. (Patient not taking: Reported on 03/05/2022) 20 tablet 0   No current facility-administered medications for this encounter.    Physical Findings: The patient is in no acute distress. Patient is alert and oriented.  vitals were not taken for this visit. .  No significant changes. Lungs are clear to auscultation bilaterally. Heart has regular rate and rhythm. No palpable cervical, supraclavicular, or axillary adenopathy. Abdomen soft, non-tender, normal bowel sounds.  Right Breast: no palpable mass, nipple discharge or bleeding. Left Breast: ***  Lab Findings: Lab Results  Component Value Date   WBC 4.5 07/23/2022   HGB 12.3 07/23/2022   HCT 36.3 07/23/2022   MCV 91.9 07/23/2022   PLT 208 07/23/2022    Radiographic Findings: No results found.  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  ***  Plan:  ***   *** minutes of total time was spent for this patient encounter, including preparation, face-to-face counseling with the patient and coordination of care, physical exam, and documentation of the encounter. ____________________________________  Blair Promise, PhD, MD  This document serves as a record of services personally performed by Gery Pray, MD. It was created on his behalf by Roney Mans, a trained medical scribe. The creation of this record is based on  the scribe's personal observations and the provider's statements to them. This document has been checked and approved by the attending provider.

## 2023-02-21 ENCOUNTER — Other Ambulatory Visit: Payer: Self-pay

## 2023-02-21 ENCOUNTER — Ambulatory Visit
Admission: RE | Admit: 2023-02-21 | Discharge: 2023-02-21 | Disposition: A | Discharge status: Home / Self Care | Payer: Medicare Other | Source: Ambulatory Visit | Attending: Radiation Oncology | Admitting: Radiation Oncology

## 2023-02-21 VITALS — BP 120/95 | HR 81 | Temp 97.6°F | Resp 18 | Ht 65.0 in | Wt 120.8 lb

## 2023-02-21 DIAGNOSIS — Z79899 Other long term (current) drug therapy: Secondary | ICD-10-CM | POA: Diagnosis not present

## 2023-02-21 DIAGNOSIS — M542 Cervicalgia: Secondary | ICD-10-CM | POA: Insufficient documentation

## 2023-02-21 DIAGNOSIS — C50412 Malignant neoplasm of upper-outer quadrant of left female breast: Secondary | ICD-10-CM

## 2023-02-21 DIAGNOSIS — Z923 Personal history of irradiation: Secondary | ICD-10-CM | POA: Diagnosis not present

## 2023-02-21 DIAGNOSIS — Z79890 Hormone replacement therapy (postmenopausal): Secondary | ICD-10-CM | POA: Insufficient documentation

## 2023-02-21 DIAGNOSIS — Z170 Estrogen receptor positive status [ER+]: Secondary | ICD-10-CM | POA: Insufficient documentation

## 2023-02-21 DIAGNOSIS — C7951 Secondary malignant neoplasm of bone: Secondary | ICD-10-CM | POA: Diagnosis not present

## 2023-02-21 MED ORDER — GABAPENTIN 100 MG PO CAPS: 100.0000 mg | ORAL_CAPSULE | Freq: Three times a day (TID) | ORAL | 1 refills | Status: AC

## 2023-02-21 NOTE — Progress Notes (Signed)
Prerana Rupal Wetherell is here today for follow up post radiation to the breast.   Breast Side: Left  They completed their radiation on: 05/12/2003   Does the patient complain of any of the following: Post radiation skin issues: Denies Breast Tenderness: Yes Breast Swelling: Reports occasional puffiness and tightness to axillary incision Lymphadema: Denies Range of Motion limitations: Denies Fatigue post radiation: Continues to deal with ongoing fatigue. She tires easily is not able to do more than one activity (like grocery shop, or go to an appointment) a day Appetite good/fair/poor: Fair  Additional comments if applicable: Reports increased pain to: bilateral breasts, spine (thoracic to lumbar), and neck. Also reports frequent headaches at night that radiate from the base of skull to her right temple. States prescription pain medication takes the edge off, but nothing full resolves her pain. She has difficulty sleeping at night due to the pain, and often is unable to sleep more than a couple hours at a time

## 2023-03-07 ENCOUNTER — Telehealth: Payer: Self-pay

## 2023-03-07 ENCOUNTER — Other Ambulatory Visit: Payer: Self-pay | Admitting: Radiation Oncology

## 2023-03-07 ENCOUNTER — Telehealth: Payer: Self-pay | Admitting: *Deleted

## 2023-03-07 MED ORDER — ALPRAZOLAM 0.5 MG PO TABS: 0.5000 mg | ORAL_TABLET | Freq: Once | ORAL | 0 refills | Status: AC

## 2023-03-07 NOTE — Telephone Encounter (Signed)
Patient called in to request a 1 time stronger dose of Xanax prior to MRI. (MRI not scheduled yet) Patient requesting medication be sent to [facility] on [address]. Patient reports taking Xanax 0.'25mg'$  daily and she is afraid that  dose will not be enough to assist with her anxiety throughout her scan. Patient also reports not wanting to run out of her daily dose of 0.'25mg'$  if she takes 2 tabs prior to scan. Pls advise.

## 2023-03-07 NOTE — Telephone Encounter (Signed)
CALLED PATIENT TO INFORM OF MRI FOR 03-10-23- ARRIVAL TIME- 11:30 AM @ [facility], NO RESTRICTIONS TO TEST, PATIENT TO RECEIVE RESULTS FROM DR. Windmill ON 03-11-23 @ 4 PM , SPOKE WITH MS. Luoma AND SHE IS AWARE OF THESE APPTS. AND THE INSTRUCTIONS

## 2023-03-08 ENCOUNTER — Telehealth: Payer: Self-pay | Admitting: *Deleted

## 2023-03-08 ENCOUNTER — Other Ambulatory Visit: Payer: Self-pay | Admitting: Radiology

## 2023-03-08 MED ORDER — ALPRAZOLAM 0.5 MG PO TABS: 0.5000 mg | ORAL_TABLET | Freq: Once | ORAL | 0 refills | Status: DC | PRN

## 2023-03-08 NOTE — Telephone Encounter (Signed)
CALLED PATIENT TO INFORM OF MRI BEING MOVED TO 03-21-23- ARRIVAL TIME- 3:30 PM @ [facility], NO RESTRICTIONS TO TEST, PATIENT TO RECEIVE RESULTS FROM DR. KINARD ON 03-25-23 @ 4:15 PM, SPOKE WITH PATIENT AND SHE IS AWARE OF THESE APPTS. AND THE INSTRUCTIONS

## 2023-03-08 NOTE — Telephone Encounter (Signed)
Called patient to inform of MRI for 03-14-23- arrival time- 2:30 pm @ [facility], no restrictions to test, patient to receive results from Dr. Sondra Come on 03-18-23 @ 4:15 pm, spoke with patient and she is aware of these appts. and the instructions

## 2023-03-10 ENCOUNTER — Ambulatory Visit (HOSPITAL_COMMUNITY): Payer: Medicare Other

## 2023-03-11 ENCOUNTER — Ambulatory Visit: Payer: Medicare Other | Admitting: Radiation Oncology

## 2023-03-14 ENCOUNTER — Ambulatory Visit (HOSPITAL_COMMUNITY): Payer: Medicare Other

## 2023-03-21 ENCOUNTER — Ambulatory Visit (HOSPITAL_COMMUNITY)
Admission: RE | Admit: 2023-03-21 | Discharge: 2023-03-21 | Disposition: A | Discharge status: Home / Self Care | Payer: Medicare Other | Source: Ambulatory Visit | Attending: Radiation Oncology | Admitting: Radiation Oncology

## 2023-03-21 DIAGNOSIS — C50412 Malignant neoplasm of upper-outer quadrant of left female breast: Secondary | ICD-10-CM | POA: Diagnosis not present

## 2023-03-21 DIAGNOSIS — R200 Anesthesia of skin: Secondary | ICD-10-CM | POA: Diagnosis not present

## 2023-03-21 DIAGNOSIS — M542 Cervicalgia: Secondary | ICD-10-CM | POA: Diagnosis not present

## 2023-03-21 MED ORDER — GADOBUTROL 1 MMOL/ML IV SOLN
5.0000 mL | Freq: Once | INTRAVENOUS | Status: AC | PRN
  Administered 2023-03-21: 5 mL via INTRAVENOUS

## 2023-03-24 NOTE — Progress Notes (Signed)
Radiation Oncology         [phone] ________________________________  Name: Carolyn Garcia MRN: [id number]  Date: 03/25/2023  DOB: [birth year]  Follow-Up Visit Note  CC: [name], FNP  [name], FNP  No diagnosis found.  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  Narrative:  The patient returns today to review recent MRI results. She was last seen here for follow-up on 02/21/23. To review from her last visit, the patient endorsed some neck pain and neurologic symptoms. In light of this, I recommended proceeding with an MRI of the cervical spine for further evaluation. She also reported a history of fibromyalgia and I recommended she consider Neurontin to help with her diffuse pain complaints.  Subsequent MRI of the cervical spine with and without contrast on 03/21/23 showed a mild broad-based disc bulge at C3-4, right uncovertebral degenerative changes, moderate right foraminal stenosis, mild left foraminal stenosis, mild broad-based disc bulge at C4-5 with mild bilateral foraminal stenosis, and broad-based disc bulging at C5-6 with bilateral uncovertebral degenerative changes and moderate bilateral foraminal stenosis. MRI otherwise showed no acute osseous injuries involving the cervical spine.       ***                              Allergies:  is allergic to entex, tyloxapol, venlafaxine, and phenylephrine-guaifenesin.  Meds: Current Outpatient Medications  Medication Sig Dispense Refill   ALPRAZolam (XANAX) 0.5 MG tablet Take 1 tablet (0.5 mg total) by mouth once as needed for up to 1 dose for anxiety. Take 1 hour prior to MRI 1 tablet 0   Biotin 5 MG TBDP Take 1 tablet by mouth daily.     cholecalciferol (VITAMIN D) 1000 UNITS  tablet Take 2,000 Units by mouth daily.     cyclobenzaprine (FLEXERIL) 10 MG tablet Take 10 mg by mouth daily as needed.      gabapentin (NEURONTIN) 100 MG capsule Take 1 capsule (100 mg total) by mouth 3 (three) times daily. 30 capsule 1   Golimumab 50 MG/0.5ML SOLN Inject 50 mg into the skin every 30 (thirty) days. (Patient not taking: Reported on 07/23/2022)     HYDROcodone-acetaminophen (NORCO/VICODIN) 5-325 MG tablet Take 1-2 tablets by mouth as needed for moderate pain (Q4-6 hrs). 360 tablet 0   levocetirizine (XYZAL) 5 MG tablet   5   levothyroxine (SYNTHROID, LEVOTHROID) 75 MCG tablet Take 75 mcg by mouth daily before breakfast.     ondansetron (ZOFRAN) 4 MG tablet Take 1 tablet (4 mg total) by mouth every 8 (eight) hours as needed for nausea or vomiting. (Patient not taking: Reported on 03/05/2022) 20 tablet 0   No current facility-administered medications for this encounter.    Physical Findings: The patient is in no acute distress. Patient is alert and oriented.  vitals were not taken for this visit. .  No significant changes. Lungs are clear to auscultation bilaterally. Heart has regular rate and rhythm. No palpable cervical, supraclavicular, or axillary adenopathy. Abdomen soft, non-tender, normal bowel sounds.  Right Breast: no palpable mass, nipple discharge or bleeding. Left Breast: ***  Lab Findings: Lab Results  Component Value Date   WBC 4.5 07/23/2022   HGB 12.3 07/23/2022  HCT 36.3 07/23/2022   MCV 91.9 07/23/2022   PLT 208 07/23/2022    Radiographic Findings: MR CERVICAL SPINE W WO CONTRAST  Result Date: 03/24/2023 CLINICAL DATA:  Right-sided neck pain.  Numbness in the hand. EXAM: MRI CERVICAL SPINE WITHOUT AND WITH CONTRAST TECHNIQUE: Multiplanar and multiecho pulse sequences of the cervical spine, to include the craniocervical junction and cervicothoracic junction, were obtained without and with intravenous contrast. CONTRAST:  34mL GADAVIST GADOBUTROL 1 MMOL/ML  IV SOLN COMPARISON:  09/15/2014 FINDINGS: Alignment: Physiologic. Vertebrae: No acute fracture, evidence of discitis, or aggressive bone lesion. Cord: Normal signal and morphology. Posterior Fossa, vertebral arteries, paraspinal tissues: Posterior fossa demonstrates no focal abnormality. Vertebral artery flow voids are maintained. Paraspinal soft tissues are unremarkable. Other: 4.2 mm pulmonary nodule in the left upper lobe partially visualized and incompletely characterized, but was better evaluated on the CT chest dated 08/16/2022. Disc levels: Discs: Disc spaces are preserved. C2-3: No significant disc bulge. No neural foraminal stenosis. No central canal stenosis. C3-4: Mild broad-based disc bulge. Right uncovertebral degenerative changes. Moderate right foraminal stenosis. Mild left foraminal stenosis. No spinal stenosis. C4-5: Mild broad-based disc bulge. Mild bilateral foraminal stenosis. No central canal stenosis. C5-6: Broad-based disc bulge. Bilateral uncovertebral degenerative changes. Moderate bilateral foraminal stenosis. No spinal stenosis. C6-7: No significant disc bulge. No neural foraminal stenosis. No central canal stenosis. C7-T1: No significant disc bulge. No neural foraminal stenosis. No central canal stenosis. IMPRESSION: 1. At C3-4 there is a mild broad-based disc bulge. Right uncovertebral degenerative changes. Moderate right foraminal stenosis. Mild left foraminal stenosis. 2. At C4-5 there is a mild broad-based disc bulge. Mild bilateral foraminal stenosis. 3. At C5-6 there is a broad-based disc bulge. Bilateral uncovertebral degenerative changes. Moderate bilateral foraminal stenosis. 4. No acute osseous injury of the cervical spine. Electronically Signed   By: Kathreen Devoid M.D.   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  ***  Plan:  ***   ***  minutes of total time was spent for this patient encounter, including preparation, face-to-face counseling with the patient and coordination of care, physical exam, and documentation of the encounter. ____________________________________  Blair Promise, PhD, MD  This document serves as a record of services personally performed by Gery Pray, MD. It was created on his behalf by Roney Mans, a trained medical scribe. The creation of this record is based on the scribe's personal observations and the provider's statements to them. This document has been checked and approved by the attending provider.

## 2023-03-25 ENCOUNTER — Encounter: Payer: Self-pay | Admitting: Radiation Oncology

## 2023-03-25 ENCOUNTER — Ambulatory Visit
Admission: RE | Admit: 2023-03-25 | Discharge: 2023-03-25 | Disposition: A | Discharge status: Home / Self Care | Payer: Medicare Other | Source: Ambulatory Visit | Attending: Radiation Oncology | Admitting: Radiation Oncology

## 2023-03-25 VITALS — BP 118/76 | HR 61 | Temp 97.6°F | Resp 18 | Ht 60.5 in | Wt 120.2 lb

## 2023-03-25 DIAGNOSIS — M5031 Other cervical disc degeneration,  high cervical region: Secondary | ICD-10-CM | POA: Insufficient documentation

## 2023-03-25 DIAGNOSIS — Z79899 Other long term (current) drug therapy: Secondary | ICD-10-CM | POA: Diagnosis not present

## 2023-03-25 DIAGNOSIS — Z79890 Hormone replacement therapy (postmenopausal): Secondary | ICD-10-CM | POA: Diagnosis not present

## 2023-03-25 DIAGNOSIS — Z923 Personal history of irradiation: Secondary | ICD-10-CM | POA: Insufficient documentation

## 2023-03-25 DIAGNOSIS — M50321 Other cervical disc degeneration at C4-C5 level: Secondary | ICD-10-CM | POA: Insufficient documentation

## 2023-03-25 DIAGNOSIS — Z170 Estrogen receptor positive status [ER+]: Secondary | ICD-10-CM | POA: Insufficient documentation

## 2023-03-25 DIAGNOSIS — C7951 Secondary malignant neoplasm of bone: Secondary | ICD-10-CM | POA: Diagnosis not present

## 2023-03-25 DIAGNOSIS — C50412 Malignant neoplasm of upper-outer quadrant of left female breast: Secondary | ICD-10-CM | POA: Insufficient documentation

## 2023-03-25 MED ORDER — ALPRAZOLAM 0.5 MG PO TABS: 0.5000 mg | ORAL_TABLET | Freq: Once | ORAL | 0 refills | Status: DC | PRN

## 2023-03-25 MED ORDER — HYDROCODONE-ACETAMINOPHEN 5-325 MG PO TABS: 1.0000 | ORAL_TABLET | ORAL | 0 refills | Status: DC | PRN

## 2023-03-25 NOTE — Progress Notes (Signed)
Anadalay Kamella Groshans is here today for follow up post radiation to the breast.   Breast Side: Left, completed treatment on 05/12/03.  Does the patient complain of any of the following: Post radiation skin issues: No Breast Tenderness: Yes, patient continues to have pain to both breast.  Breast Swelling: No Lymphadema: No Range of Motion limitations: No Fatigue post radiation: Yes Appetite good/fair/poor:  Fair   Additional comments if applicable:  Patient requesting refill on Hydrocodone-APAP 5-325mg .   Patient also reports severe pain to neck,back and breast.   BP 118/76 (BP Location: Right Arm, Patient Position: Sitting, Cuff Size: Normal)   Pulse 61   Temp 97.6 F (36.4 C)   Resp 18   Ht 5' 0.5" (1.537 m)   Wt 120 lb 3.2 oz (54.5 kg)   SpO2 100%   BMI 23.09 kg/m

## 2023-03-26 ENCOUNTER — Telehealth: Payer: Self-pay | Admitting: *Deleted

## 2023-03-26 NOTE — Telephone Encounter (Signed)
Called patient to inform of MRI for 04-04-23- arrival time- 3:30 pm @ [facility], no restrictions to test, patient to receive results from Dr. Sondra Come on 04-08-23  @ 4 pm, lvm for a return call

## 2023-03-28 ENCOUNTER — Telehealth: Payer: Self-pay | Admitting: *Deleted

## 2023-03-28 NOTE — Telephone Encounter (Signed)
CALLED PATIENT TO INFORM HER THAT I RECEIVED HER MESSAGE, LVM ON HER VM FOR A RETURN CALL

## 2023-04-04 ENCOUNTER — Ambulatory Visit (HOSPITAL_COMMUNITY): Payer: Medicare Other

## 2023-04-08 ENCOUNTER — Ambulatory Visit: Payer: Medicare Other | Admitting: Radiation Oncology

## 2023-04-17 DIAGNOSIS — R5382 Chronic fatigue, unspecified: Secondary | ICD-10-CM | POA: Diagnosis not present

## 2023-04-17 DIAGNOSIS — E039 Hypothyroidism, unspecified: Secondary | ICD-10-CM | POA: Diagnosis not present

## 2023-05-08 ENCOUNTER — Telehealth: Payer: Self-pay | Admitting: *Deleted

## 2023-05-08 NOTE — Telephone Encounter (Signed)
CALLED PATIENT TO INFORM OF MRI FOR 05-23-23- ARRIVAL TIME- 2:30 PM @ [facility], NO RESTRICTIONS TO TEST, PATIENT TO RECEIVE RESULTS FROM DR.KINARD ON 05-27-23 @ 3:45 PM, LVM FOR A RETURN CALL

## 2023-05-09 ENCOUNTER — Telehealth: Payer: Self-pay | Admitting: *Deleted

## 2023-05-09 NOTE — Telephone Encounter (Signed)
Called patient to inform that fu appt. has been moved to 05-30-23 @ 4:15 pm, lvm for a return call

## 2023-05-23 ENCOUNTER — Ambulatory Visit (HOSPITAL_COMMUNITY): Payer: Medicare Other

## 2023-05-23 ENCOUNTER — Other Ambulatory Visit: Payer: Self-pay | Admitting: Radiation Oncology

## 2023-05-23 ENCOUNTER — Telehealth: Payer: Self-pay

## 2023-05-23 MED ORDER — HYDROCODONE-ACETAMINOPHEN 5-325 MG PO TABS: 1.0000 | ORAL_TABLET | ORAL | 0 refills | Status: DC | PRN

## 2023-05-23 NOTE — Telephone Encounter (Signed)
Patient called in requesting refill on Hydrocodone-apap 5-325mg . Patient requesting prescription be sent to [facility]  on [address].

## 2023-05-27 ENCOUNTER — Ambulatory Visit: Payer: Self-pay | Admitting: Radiation Oncology

## 2023-05-30 ENCOUNTER — Ambulatory Visit: Payer: Medicare Other | Admitting: Radiation Oncology

## 2023-07-19 ENCOUNTER — Telehealth: Payer: Self-pay | Admitting: *Deleted

## 2023-07-19 NOTE — Telephone Encounter (Signed)
Returned patient's phone call, lvm for a return call 

## 2023-08-05 ENCOUNTER — Telehealth: Payer: Self-pay

## 2023-08-05 NOTE — Telephone Encounter (Signed)
Patient called in requesting refill on Hydrocodone-apap 5-325mg . Requesting prescription be sent to [facility].

## 2023-08-06 ENCOUNTER — Other Ambulatory Visit: Payer: Self-pay | Admitting: Radiation Oncology

## 2023-08-06 MED ORDER — HYDROCODONE-ACETAMINOPHEN 5-325 MG PO TABS: 1.0000 | ORAL_TABLET | ORAL | 0 refills | Status: DC | PRN

## 2023-10-14 ENCOUNTER — Telehealth: Payer: Self-pay

## 2023-10-14 ENCOUNTER — Other Ambulatory Visit: Payer: Self-pay | Admitting: Radiation Oncology

## 2023-10-14 MED ORDER — HYDROCODONE-ACETAMINOPHEN 5-325 MG PO TABS: 1.0000 | ORAL_TABLET | ORAL | 0 refills | Status: DC | PRN

## 2023-10-14 NOTE — Telephone Encounter (Signed)
Patient called in requesting refill on Hydrocodone-APAP 5-325mg . Patient requesting medication be sent to [facility] on [address].

## 2023-12-16 ENCOUNTER — Telehealth: Payer: Self-pay

## 2023-12-16 ENCOUNTER — Other Ambulatory Visit: Payer: Self-pay | Admitting: Radiation Oncology

## 2023-12-16 ENCOUNTER — Telehealth: Payer: Self-pay | Admitting: *Deleted

## 2023-12-16 DIAGNOSIS — C50412 Malignant neoplasm of upper-outer quadrant of left female breast: Secondary | ICD-10-CM

## 2023-12-16 MED ORDER — HYDROCODONE-ACETAMINOPHEN 5-325 MG PO TABS: 1.0000 | ORAL_TABLET | ORAL | 0 refills | Status: DC | PRN

## 2023-12-16 NOTE — Telephone Encounter (Signed)
Patient called in requesting refill on Hydrocodone-APAP 5-325mg . Patient requesting medication be sent to [facility] on [address]

## 2023-12-16 NOTE — Telephone Encounter (Signed)
Returned patient's phone call, spoke with patient 

## 2023-12-20 ENCOUNTER — Telehealth: Payer: Self-pay | Admitting: *Deleted

## 2023-12-20 NOTE — Telephone Encounter (Signed)
RETURNED PATIENT'S PHONE CALL, LVM FOR A RETURN CALL 

## 2023-12-20 NOTE — Telephone Encounter (Signed)
Returned patient's phone call, spoke with patient 

## 2023-12-30 ENCOUNTER — Telehealth: Payer: Self-pay

## 2023-12-30 ENCOUNTER — Other Ambulatory Visit: Payer: Self-pay | Admitting: Radiation Oncology

## 2023-12-30 DIAGNOSIS — C50412 Malignant neoplasm of upper-outer quadrant of left female breast: Secondary | ICD-10-CM

## 2023-12-30 MED ORDER — HYDROCODONE-ACETAMINOPHEN 5-325 MG PO TABS: 1.0000 | ORAL_TABLET | ORAL | 0 refills | Status: DC | PRN

## 2023-12-30 NOTE — Telephone Encounter (Signed)
 Patient called in requesting when she schedules for her thoracic MRI can she get prescribed a higher dose of Xanax?

## 2023-12-30 NOTE — Telephone Encounter (Signed)
 Patient left a voicemail requesting a refill on Hydrocodone acetaminophen Norco/Vicodin 5-325 MG tablet. She uses Therapist, occupational at [facility] [address]

## 2023-12-31 ENCOUNTER — Other Ambulatory Visit: Payer: Self-pay | Admitting: Radiation Oncology

## 2023-12-31 MED ORDER — ALPRAZOLAM 1 MG PO TABS: 1.0000 mg | ORAL_TABLET | Freq: Once | ORAL | 0 refills | Status: AC | PRN

## 2024-02-27 ENCOUNTER — Other Ambulatory Visit: Payer: Self-pay | Admitting: Radiation Oncology

## 2024-02-27 ENCOUNTER — Telehealth: Payer: Self-pay

## 2024-02-27 DIAGNOSIS — C50412 Malignant neoplasm of upper-outer quadrant of left female breast: Secondary | ICD-10-CM

## 2024-02-27 DIAGNOSIS — T1501XA Foreign body in cornea, right eye, initial encounter: Secondary | ICD-10-CM | POA: Diagnosis not present

## 2024-02-27 MED ORDER — HYDROCODONE-ACETAMINOPHEN 5-325 MG PO TABS: 1.0000 | ORAL_TABLET | ORAL | 0 refills | Status: DC | PRN

## 2024-02-27 NOTE — Telephone Encounter (Signed)
 Ms. Hashimi called in requesting a refill on HYDROcodone-acetaminophen (NORCO/VICODIN) 5-325 MG tablet.   Pharmacy  [id number] - [place], [place] - [address] AT [facility] OF [address]

## 2024-03-17 ENCOUNTER — Ambulatory Visit: Payer: Self-pay | Admitting: Radiation Oncology

## 2024-04-22 ENCOUNTER — Other Ambulatory Visit: Payer: Self-pay | Admitting: Radiation Oncology

## 2024-04-22 ENCOUNTER — Telehealth: Payer: Self-pay

## 2024-04-22 DIAGNOSIS — C50412 Malignant neoplasm of upper-outer quadrant of left female breast: Secondary | ICD-10-CM

## 2024-04-22 MED ORDER — HYDROCODONE-ACETAMINOPHEN 5-325 MG PO TABS: 1.0000 | ORAL_TABLET | ORAL | 0 refills | Status: DC | PRN

## 2024-04-22 NOTE — Telephone Encounter (Signed)
 Carolyn Garcia called in requesting a prescription refill on HYDROcodone -acetaminophen  (NORCO/VICODIN) 5-325 MG tablet. She reports that her pain is an 8/10 to bilateral breast and rib pain. Her preferred pharmacy is [facility] on [address]. Please advise.

## 2024-05-20 ENCOUNTER — Other Ambulatory Visit: Payer: Self-pay | Admitting: Radiation Oncology

## 2024-05-20 DIAGNOSIS — C50412 Malignant neoplasm of upper-outer quadrant of left female breast: Secondary | ICD-10-CM

## 2024-05-20 MED ORDER — HYDROCODONE-ACETAMINOPHEN 5-325 MG PO TABS: 1.0000 | ORAL_TABLET | ORAL | 0 refills | Status: DC | PRN

## 2024-06-22 ENCOUNTER — Other Ambulatory Visit: Payer: Self-pay | Admitting: Radiation Oncology

## 2024-06-22 ENCOUNTER — Telehealth: Payer: Self-pay

## 2024-06-22 MED ORDER — HYDROCODONE-ACETAMINOPHEN 10-325 MG PO TABS: 1.0000 | ORAL_TABLET | ORAL | 0 refills | Status: DC | PRN

## 2024-06-22 NOTE — Telephone Encounter (Signed)
 Carolyn Garcia called in requesting a prescription refill on HYDROcodone -acetaminophen  (NORCO/VICODIN) 5-325 MG tablet. Patient requesting medication be sent to [facility] on [address].

## 2024-06-23 ENCOUNTER — Telehealth: Payer: Self-pay | Admitting: Radiation Oncology

## 2024-06-23 NOTE — Telephone Encounter (Signed)
 Left message for patient to call back to schedule a follow-up appointment per 6/30 secure chat.

## 2024-06-30 ENCOUNTER — Telehealth: Payer: Self-pay | Admitting: Radiation Oncology

## 2024-06-30 NOTE — Telephone Encounter (Signed)
 Left message for patient to call back to schedule follow-up appointment per 6/30 secure chat.

## 2024-07-02 ENCOUNTER — Telehealth: Payer: Self-pay | Admitting: Radiation Oncology

## 2024-07-02 NOTE — Telephone Encounter (Signed)
 7/10 @ 9:05 am Left voicemail for patient to call our office to confirm if 7/21 @ 4:00 pm for her Follow up appt.  Waiting on call back.

## 2024-07-13 ENCOUNTER — Encounter: Payer: Self-pay | Admitting: Radiation Oncology

## 2024-07-13 ENCOUNTER — Ambulatory Visit
Admission: RE | Admit: 2024-07-13 | Discharge: 2024-07-13 | Disposition: A | Discharge status: Home / Self Care | Source: Ambulatory Visit | Attending: Radiation Oncology | Admitting: Radiation Oncology

## 2024-07-13 VITALS — BP 139/94 | HR 65 | Temp 97.5°F | Resp 18 | Ht 65.0 in | Wt 126.0 lb

## 2024-07-13 DIAGNOSIS — C50412 Malignant neoplasm of upper-outer quadrant of left female breast: Secondary | ICD-10-CM | POA: Diagnosis not present

## 2024-07-13 DIAGNOSIS — Z170 Estrogen receptor positive status [ER+]: Secondary | ICD-10-CM | POA: Diagnosis not present

## 2024-07-13 DIAGNOSIS — C7951 Secondary malignant neoplasm of bone: Secondary | ICD-10-CM | POA: Diagnosis not present

## 2024-07-13 MED ORDER — ALPRAZOLAM 0.5 MG PO TABS: 0.5000 mg | ORAL_TABLET | Freq: Once | ORAL | 0 refills | Status: AC | PRN

## 2024-07-13 MED ORDER — MORPHINE SULFATE ER 15 MG PO TBCR: 15.0000 mg | EXTENDED_RELEASE_TABLET | Freq: Two times a day (BID) | ORAL | 0 refills | Status: DC

## 2024-07-13 NOTE — Progress Notes (Signed)
 Radiation Oncology         [phone] ________________________________  Name: Carolyn Garcia MRN: [id number]  Date: 07/13/2024  DOB: 08-18-1966  Follow-Up Visit Note  CC: Royden Ronal Czar, FNP  Royden Ronal Czar, FNP    ICD-10-CM   1. 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  Narrative:  The patient returns today for routine follow-up. She was last seen here for follow-up on 03/25/23.         No significant interval history since the patient was last seen for follow-up.   Patient returns today for routine follow-up.  She admits she has not followed up with her physicians as recommended.  Unfortunately her close father died of cancer and she was his primary care giver for several months.  With his passing she knows the importance of taking care of herself.  On evaluation today she reports ongoing chronic pain in the left breast.  She denies any nipple discharge or bleeding.  She also reports pain in the thoracic spine and right lumbosacral area.  She denies any numbness or weakness in her lower extremities or any issues with bowel or bladder incontinence.                        Allergies:  is allergic to entex, tyloxapol, venlafaxine, and phenylephrine-guaifenesin.  Meds: Current Outpatient Medications  Medication Sig Dispense Refill   Biotin 5 MG TBDP Take 1 tablet by mouth daily.     cholecalciferol (VITAMIN D ) 1000 UNITS tablet Take 2,000 Units by mouth daily.     HYDROcodone -acetaminophen  (NORCO) 10-325 MG tablet Take 1 tablet by mouth every 4 (four) hours as  needed for severe pain (pain score 7-10). 180 tablet 0   levocetirizine (XYZAL) 5 MG tablet   5   levothyroxine (SYNTHROID, LEVOTHROID) 75 MCG tablet Take 75 mcg by mouth daily before breakfast.     morphine  (MS CONTIN ) 15 MG 12 hr tablet Take 1 tablet (15 mg total) by mouth every 12 (twelve) hours. 30 tablet 0   ALPRAZolam  (XANAX ) 0.5 MG tablet Take 1 tablet (0.5 mg total) by mouth once as needed for up to 1 dose for anxiety. Take 1 hour prior to MRI 5 tablet 0   ALPRAZolam  (XANAX ) 1 MG tablet Take 1 tablet (1 mg total) by mouth once as needed for up to 1 dose for anxiety. Take 1 hour prior to MRI (Patient not taking: Reported on 07/13/2024) 5 tablet 0   cyclobenzaprine (FLEXERIL) 10 MG tablet Take 10 mg by mouth daily as needed.  (Patient not taking: Reported on 07/13/2024)     gabapentin  (NEURONTIN ) 100 MG capsule Take 1 capsule (100 mg total) by mouth 3 (three) times daily. (Patient not taking: Reported on 07/13/2024) 30 capsule 1   Golimumab 50 MG/0.5ML SOLN Inject 50 mg into the skin every 30 (thirty) days. (Patient not taking: Reported on 07/13/2024)     ondansetron  (ZOFRAN ) 4 MG tablet Take 1 tablet (4 mg total) by mouth every 8 (eight) hours as needed for nausea or vomiting. (Patient  not taking: Reported on 07/13/2024) 20 tablet 0   No current facility-administered medications for this encounter.    Physical Findings: The patient is in no acute distress. Patient is alert and oriented.  height is 5' 5 (1.651 m) and weight is 126 lb (57.2 kg). Her temperature is 97.5 F (36.4 C) (abnormal). Her blood pressure is 139/94 (abnormal) and her pulse is 65. Her respiration is 18 and oxygen saturation is 99%. .  No significant changes. Lungs are clear to auscultation bilaterally. Heart has regular rate and rhythm. No palpable cervical, supraclavicular, or axillary adenopathy. Abdomen soft, non-tender, normal bowel sounds.  Right Breast: no palpable mass, nipple discharge or bleeding. Left Breast:  Lumpectomy scar noted in the upper outer quadrant with some induration in this area which has been noticed on previous exams.  Patient continues to be exquisitely tender with palpation along the breast more so near her lumpectomy scar.  No dominant mass appreciated in the breast nipple discharge or bleeding.  On neurological examination motor strength is 5 out of 5 in the proximal and distal muscle groups of the lower extremities.  Palpation along the spine area reveals some tenderness in the mid thoracic spine and right lumbosacral area.  Lab Findings: Lab Results  Component Value Date   WBC 4.5 07/23/2022   HGB 12.3 07/23/2022   HCT 36.3 07/23/2022   MCV 91.9 07/23/2022   PLT 208 07/23/2022    Radiographic Findings: No results found.  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  She has new issues with pain along the thoracolumbar spine area.  In light of her previous history of breast cancer I would like to order a thoracic and lumbar MRI to rule out malignancy.  She is agreeable.  She also admits significant pain in the spine area and in the short-term we will place her on MS Contin  to give her some possible relief.  Discussed that she will need to cut back on her short acting medication once this medication has started.  Plan: Thoracolumbar MRI with and without contrast.  Follow-up after scans complete.   30 minutes of total time was spent for this patient encounter, including preparation, face-to-face counseling with the patient and coordination of care, physical exam, and documentation of the encounter. ____________________________________  Lynwood CHARM Nasuti, PhD, MD  This document serves as a record of services personally performed by Lynwood Nasuti, MD. It was created on his behalf by Dorthy Fuse, a trained medical scribe. The creation of this record is based on the scribe's personal  observations and the provider's statements to them. This document has been checked and approved by the attending provider.

## 2024-07-13 NOTE — Progress Notes (Addendum)
    Location-  Left Breast  They completed their radiation on: 05/12/2003  Does the patient complain of any of the following: Post radiation skin issues: Patient denies Joint Pain/ Swelling: Joint pain   Range of Motion limitations: Yes  Fatigue post radiation: Yes Appetite good/fair/poor: Fair   Additional comments if applicable: She reports having 7/10 back pain  BP (!) 139/94 (BP Location: Right Arm, Patient Position: Sitting, Cuff Size: Normal)   Pulse 65   Temp (!) 97.5 F (36.4 C)   Resp 18   Ht 5' 5 (1.651 m)   Wt 126 lb (57.2 kg)   SpO2 99%   BMI 20.97 kg/m

## 2024-07-15 ENCOUNTER — Telehealth: Payer: Self-pay

## 2024-07-15 NOTE — Telephone Encounter (Signed)
 Patient left a voicemail stating that when she has her MRI can she be prescribed Valium  versus Xanax ?

## 2024-07-21 ENCOUNTER — Other Ambulatory Visit: Payer: Self-pay | Admitting: Radiation Oncology

## 2024-07-21 MED ORDER — DIAZEPAM 5 MG PO TABS: 5.0000 mg | ORAL_TABLET | Freq: Four times a day (QID) | ORAL | 0 refills | Status: AC | PRN

## 2024-07-24 ENCOUNTER — Telehealth: Payer: Self-pay | Admitting: *Deleted

## 2024-07-24 NOTE — Telephone Encounter (Signed)
 Called patient to ask about when she wants to come in for a lab, lvm for a return call

## 2024-07-28 ENCOUNTER — Telehealth: Payer: Self-pay

## 2024-07-28 NOTE — Telephone Encounter (Signed)
 [name] left a voicemail requesting a refill on HYDROcodone -acetaminophen  (NORCO) 10-325 MG tablet . She is requesting a stronger dose. [facility] on [address] and [address] is her preferred pharmacy.

## 2024-07-30 ENCOUNTER — Other Ambulatory Visit: Payer: Self-pay | Admitting: Radiation Oncology

## 2024-08-03 ENCOUNTER — Telehealth: Payer: Self-pay | Admitting: *Deleted

## 2024-08-03 NOTE — Telephone Encounter (Signed)
 CALLED PATIENT TO ASK ABOUT WHEN SHE WOULD LIKE TO DO A LAB, LVM FOR A RETURN CALL

## 2024-08-05 ENCOUNTER — Other Ambulatory Visit: Payer: Self-pay | Admitting: Radiation Oncology

## 2024-08-05 ENCOUNTER — Telehealth: Payer: Self-pay

## 2024-08-05 MED ORDER — MORPHINE SULFATE ER 15 MG PO TBCR: 15.0000 mg | EXTENDED_RELEASE_TABLET | Freq: Three times a day (TID) | ORAL | 0 refills | Status: DC

## 2024-08-05 NOTE — Telephone Encounter (Addendum)
 Patient called in requesting a refill. Called to verify rx and was not received on 07/30/24. Patients preferred pharmacy is [facility] on [address].

## 2024-08-06 ENCOUNTER — Telehealth: Payer: Self-pay

## 2024-08-20 ENCOUNTER — Telehealth: Payer: Self-pay

## 2024-08-20 ENCOUNTER — Other Ambulatory Visit: Payer: Self-pay | Admitting: Radiation Oncology

## 2024-08-20 MED ORDER — MORPHINE SULFATE ER 15 MG PO TBCR: 15.0000 mg | EXTENDED_RELEASE_TABLET | Freq: Three times a day (TID) | ORAL | 0 refills | Status: DC

## 2024-08-20 NOTE — Telephone Encounter (Signed)
 Patient requesting a refill on morphine  (MS CONTIN ) 15 MG 12 hr tablet. Her preferred pharmacy is [facility] on [address].

## 2024-09-03 ENCOUNTER — Other Ambulatory Visit: Payer: Self-pay | Admitting: Radiation Oncology

## 2024-09-03 ENCOUNTER — Telehealth: Payer: Self-pay

## 2024-09-03 MED ORDER — MORPHINE SULFATE ER 15 MG PO TBCR: 15.0000 mg | EXTENDED_RELEASE_TABLET | Freq: Three times a day (TID) | ORAL | 0 refills | Status: DC

## 2024-09-03 NOTE — Telephone Encounter (Signed)
 Patient left a voicemail requesting refill on morphine  (MS CONTIN ) 15 MG 12 hr tablet. Her preferred pharmacy [facility] on [address]

## 2024-09-17 ENCOUNTER — Other Ambulatory Visit: Payer: Self-pay | Admitting: Radiation Oncology

## 2024-09-17 ENCOUNTER — Telehealth: Payer: Self-pay

## 2024-09-17 MED ORDER — HYDROCODONE-ACETAMINOPHEN 10-325 MG PO TABS: 1.0000 | ORAL_TABLET | ORAL | 0 refills | Status: DC | PRN

## 2024-09-17 NOTE — Telephone Encounter (Signed)
 Patient requesting a refill on HYDROcodone -acetaminophen  (NORCO) 10-325 MG tablet. Her preferred pharmacy is [facility] on [address] [phone]

## 2024-09-18 ENCOUNTER — Other Ambulatory Visit: Payer: Self-pay | Admitting: Radiation Oncology

## 2024-09-18 ENCOUNTER — Telehealth: Payer: Self-pay

## 2024-09-18 MED ORDER — MORPHINE SULFATE ER 15 MG PO TBCR: 15.0000 mg | EXTENDED_RELEASE_TABLET | Freq: Three times a day (TID) | ORAL | 0 refills | Status: DC

## 2024-09-18 NOTE — Telephone Encounter (Addendum)
 Spoke with pharmacy tech at [facility] regarding a prescription that needed to be cancelled. Pharmacy tech verbalized understanding.

## 2024-09-30 ENCOUNTER — Telehealth: Payer: Self-pay

## 2024-09-30 NOTE — Telephone Encounter (Signed)
 Patient called in requesting a call to discuss pain management due to pain in right breast and axilla. Patient can be reached at [phone].

## 2024-10-01 ENCOUNTER — Telehealth: Payer: Self-pay

## 2024-10-01 ENCOUNTER — Other Ambulatory Visit: Payer: Self-pay | Admitting: Radiation Oncology

## 2024-10-01 MED ORDER — MORPHINE SULFATE ER 15 MG PO TBCR: 15.0000 mg | EXTENDED_RELEASE_TABLET | Freq: Three times a day (TID) | ORAL | 0 refills | Status: DC

## 2024-10-01 MED ORDER — HYDROCODONE-ACETAMINOPHEN 5-325 MG PO TABS: 1.0000 | ORAL_TABLET | Freq: Four times a day (QID) | ORAL | 0 refills | Status: DC | PRN

## 2024-10-01 NOTE — Telephone Encounter (Signed)
 Patient called in requesting refill on Morphine  15mg . Patient requesting prescription be sent to [facility] on [address].

## 2024-10-14 ENCOUNTER — Telehealth: Payer: Self-pay

## 2024-10-14 ENCOUNTER — Other Ambulatory Visit: Payer: Self-pay | Admitting: Radiation Oncology

## 2024-10-14 MED ORDER — HYDROCODONE-ACETAMINOPHEN 5-325 MG PO TABS: 1.0000 | ORAL_TABLET | Freq: Four times a day (QID) | ORAL | 0 refills | Status: DC | PRN

## 2024-10-14 NOTE — Telephone Encounter (Signed)
 Patient called in requesting refill on Hydrocodone -apap 5-325mg  and Morphine  15mg . Requesting prescriptions be sent to [facility] on [address].

## 2024-10-15 ENCOUNTER — Other Ambulatory Visit: Payer: Self-pay | Admitting: Radiation Oncology

## 2024-10-15 MED ORDER — MORPHINE SULFATE ER 15 MG PO TBCR: 15.0000 mg | EXTENDED_RELEASE_TABLET | Freq: Three times a day (TID) | ORAL | 0 refills | Status: DC

## 2024-10-28 ENCOUNTER — Other Ambulatory Visit: Payer: Self-pay | Admitting: Radiation Oncology

## 2024-10-28 ENCOUNTER — Telehealth: Payer: Self-pay

## 2024-10-28 MED ORDER — MORPHINE SULFATE ER 15 MG PO TBCR: 15.0000 mg | EXTENDED_RELEASE_TABLET | Freq: Three times a day (TID) | ORAL | 0 refills | Status: DC

## 2024-10-28 MED ORDER — HYDROCODONE-ACETAMINOPHEN 5-325 MG PO TABS: 1.0000 | ORAL_TABLET | Freq: Four times a day (QID) | ORAL | 0 refills | Status: DC | PRN

## 2024-10-28 NOTE — Telephone Encounter (Signed)
 Ms. Kreis called in requesting a refill on Hydrocodone -apap 5-325mg  and Morphine  15mg . Requesting prescriptions be sent to [facility] on [address].

## 2024-11-11 ENCOUNTER — Other Ambulatory Visit: Payer: Self-pay | Admitting: Radiation Oncology

## 2024-11-11 ENCOUNTER — Telehealth: Payer: Self-pay

## 2024-11-11 ENCOUNTER — Telehealth: Payer: Self-pay | Admitting: *Deleted

## 2024-11-11 MED ORDER — HYDROCODONE-ACETAMINOPHEN 5-325 MG PO TABS: 1.0000 | ORAL_TABLET | Freq: Four times a day (QID) | ORAL | 0 refills | Status: DC | PRN

## 2024-11-11 MED ORDER — MORPHINE SULFATE ER 15 MG PO TBCR: 15.0000 mg | EXTENDED_RELEASE_TABLET | Freq: Three times a day (TID) | ORAL | 0 refills | Status: DC

## 2024-11-11 NOTE — Telephone Encounter (Signed)
 RETURNED PATIENT'S PHONE CALL, SPOKE WITH PATIENT. ?

## 2024-11-11 NOTE — Telephone Encounter (Signed)
 Patient called in and had some questions about her medications. She also is requesting a refill on her meds and states that she has run out of her Hydrocodone  -acetaminophen  5-325 MG tablet.

## 2024-12-07 ENCOUNTER — Other Ambulatory Visit: Payer: Self-pay | Admitting: Radiation Oncology

## 2024-12-07 ENCOUNTER — Telehealth: Payer: Self-pay

## 2024-12-07 MED ORDER — MORPHINE SULFATE ER 15 MG PO TBCR: 15.0000 mg | EXTENDED_RELEASE_TABLET | Freq: Three times a day (TID) | ORAL | 0 refills | Status: DC

## 2024-12-07 MED ORDER — HYDROCODONE-ACETAMINOPHEN 5-325 MG PO TABS: 1.0000 | ORAL_TABLET | Freq: Four times a day (QID) | ORAL | 0 refills | Status: DC | PRN

## 2024-12-07 NOTE — Telephone Encounter (Signed)
 SABRA

## 2024-12-07 NOTE — Telephone Encounter (Addendum)
 Patient called in requesting a refill on HYDROcodone -acetaminophen  (NORCO/VICODIN) 5-325 MG tablet and Morphine  (MS CONTIN ) 15 MG 12 hr tablet. Her preferred pharmacy is [facility] on [address]

## 2025-01-05 ENCOUNTER — Other Ambulatory Visit: Payer: Self-pay | Admitting: Radiation Oncology

## 2025-01-05 ENCOUNTER — Telehealth: Payer: Self-pay

## 2025-01-05 MED ORDER — HYDROCODONE-ACETAMINOPHEN 5-325 MG PO TABS: 1.0000 | ORAL_TABLET | Freq: Four times a day (QID) | ORAL | 0 refills | Status: AC | PRN

## 2025-01-05 MED ORDER — MORPHINE SULFATE ER 15 MG PO TBCR: 15.0000 mg | EXTENDED_RELEASE_TABLET | Freq: Three times a day (TID) | ORAL | 0 refills | Status: AC

## 2025-01-05 NOTE — Telephone Encounter (Signed)
 Patient called in requesting a refill on HYDROcodone -acetaminophen  (NORCO/VICODIN) 5-325 MG tablet and morphine  (MS CONTIN ) 15 MG 12 hr tablet. Her preferred pharmacy is [facility].
